# Patient Record
Sex: Male | Born: 1985 | Race: White | Hispanic: No | Marital: Married | Smoking: Current every day smoker
Health system: Southern US, Community
[De-identification: ages and names within clinical notes are randomized; demographics above are authoritative.]

## PROBLEM LIST (undated history)

## (undated) DIAGNOSIS — J189 Pneumonia, unspecified organism: Secondary | ICD-10-CM

## (undated) DIAGNOSIS — J9383 Other pneumothorax: Secondary | ICD-10-CM

## (undated) DIAGNOSIS — F32A Depression, unspecified: Secondary | ICD-10-CM

## (undated) DIAGNOSIS — K219 Gastro-esophageal reflux disease without esophagitis: Secondary | ICD-10-CM

## (undated) DIAGNOSIS — F419 Anxiety disorder, unspecified: Secondary | ICD-10-CM

## (undated) DIAGNOSIS — Z720 Tobacco use: Secondary | ICD-10-CM

## (undated) DIAGNOSIS — G2581 Restless legs syndrome: Secondary | ICD-10-CM

## (undated) HISTORY — PX: OTHER SURGICAL HISTORY: SHX169

## (undated) HISTORY — DX: Other pneumothorax: J93.83

---

## 2014-02-17 ENCOUNTER — Inpatient Hospital Stay (HOSPITAL_COMMUNITY): Payer: Self-pay

## 2014-02-17 ENCOUNTER — Inpatient Hospital Stay (HOSPITAL_COMMUNITY)
Admission: EM | Admit: 2014-02-17 | Discharge: 2014-02-21 | DRG: 201 | Disposition: A | Discharge status: Home / Self Care | Payer: Self-pay | Attending: Thoracic Surgery (Cardiothoracic Vascular Surgery) | Admitting: Thoracic Surgery (Cardiothoracic Vascular Surgery)

## 2014-02-17 ENCOUNTER — Emergency Department (HOSPITAL_COMMUNITY): Payer: Self-pay

## 2014-02-17 ENCOUNTER — Encounter (HOSPITAL_COMMUNITY): Payer: Self-pay | Admitting: Emergency Medicine

## 2014-02-17 DIAGNOSIS — J939 Pneumothorax, unspecified: Secondary | ICD-10-CM

## 2014-02-17 DIAGNOSIS — F17200 Nicotine dependence, unspecified, uncomplicated: Secondary | ICD-10-CM | POA: Diagnosis present

## 2014-02-17 DIAGNOSIS — Y838 Other surgical procedures as the cause of abnormal reaction of the patient, or of later complication, without mention of misadventure at the time of the procedure: Secondary | ICD-10-CM | POA: Diagnosis present

## 2014-02-17 DIAGNOSIS — R079 Chest pain, unspecified: Secondary | ICD-10-CM

## 2014-02-17 DIAGNOSIS — J9383 Other pneumothorax: Secondary | ICD-10-CM

## 2014-02-17 DIAGNOSIS — J930 Spontaneous tension pneumothorax: Principal | ICD-10-CM | POA: Diagnosis present

## 2014-02-17 DIAGNOSIS — J9311 Primary spontaneous pneumothorax: Secondary | ICD-10-CM

## 2014-02-17 DIAGNOSIS — J95812 Postprocedural air leak: Secondary | ICD-10-CM | POA: Diagnosis present

## 2014-02-17 DIAGNOSIS — Z720 Tobacco use: Secondary | ICD-10-CM

## 2014-02-17 HISTORY — DX: Other pneumothorax: J93.83

## 2014-02-17 HISTORY — DX: Tobacco use: Z72.0

## 2014-02-17 LAB — BASIC METABOLIC PANEL
Anion gap: 14 (ref 5–15)
BUN: 12 mg/dL (ref 6–23)
CO2: 24 mEq/L (ref 19–32)
Calcium: 9.5 mg/dL (ref 8.4–10.5)
Chloride: 99 mEq/L (ref 96–112)
Creatinine, Ser: 1.03 mg/dL (ref 0.50–1.35)
GFR calc Af Amer: >90 mL/min (ref >90)
GFR calc non Af Amer: >90 mL/min (ref >90)
Glucose, Bld: 115 mg/dL — ABNORMAL HIGH (ref 70–99)
Potassium: 4.1 mEq/L (ref 3.7–5.3)
Sodium: 137 mEq/L (ref 137–147)

## 2014-02-17 LAB — CBC WITH DIFFERENTIAL/PLATELET
Basophils Absolute: 0.1 10*3/uL (ref 0.0–0.1)
Basophils Relative: 1 % (ref 0–1)
Eosinophils Absolute: 0.1 10*3/uL (ref 0.0–0.7)
Eosinophils Relative: 1 % (ref 0–5)
HCT: 45.8 % (ref 39.0–52.0)
Hemoglobin: 16.3 g/dL (ref 13.0–17.0)
Lymphocytes Relative: 19 % (ref 12–46)
Lymphs Abs: 2.8 10*3/uL (ref 0.7–4.0)
MCH: 30.1 pg (ref 26.0–34.0)
MCHC: 35.6 g/dL (ref 30.0–36.0)
MCV: 84.5 fL (ref 78.0–100.0)
Monocytes Absolute: 1 10*3/uL (ref 0.1–1.0)
Monocytes Relative: 7 % (ref 3–12)
Neutro Abs: 10.8 10*3/uL — ABNORMAL HIGH (ref 1.7–7.7)
Neutrophils Relative %: 72 % (ref 43–77)
Platelets: 236 10*3/uL (ref 150–400)
RBC: 5.42 MIL/uL (ref 4.22–5.81)
RDW: 13.1 % (ref 11.5–15.5)
WBC: 14.8 10*3/uL — ABNORMAL HIGH (ref 4.0–10.5)

## 2014-02-17 LAB — I-STAT TROPONIN, ED: Troponin i, poc: 0.01 ng/mL (ref 0.00–0.08)

## 2014-02-17 MED ORDER — LIDOCAINE-EPINEPHRINE 2 %-1:200000 IJ SOLN
10.0000 mL | Freq: Once | INTRAMUSCULAR | Status: AC
Start: 2014-02-17 — End: 2014-02-17
  Administered 2014-02-17: 10 mL via INTRADERMAL

## 2014-02-17 MED ORDER — OXYCODONE HCL 5 MG PO TABS: 5.0000 mg | ORAL_TABLET | ORAL | Status: DC | PRN

## 2014-02-17 MED ORDER — DOCUSATE SODIUM 100 MG PO CAPS
200.0000 mg | ORAL_CAPSULE | Freq: Every day | ORAL | Status: DC
  Administered 2014-02-17 – 2014-02-21 (×4): 200 mg via ORAL
  Filled 2014-02-17 (×5): qty 2

## 2014-02-17 MED ORDER — MORPHINE SULFATE 2 MG/ML IJ SOLN: 2.0000 mg | INTRAMUSCULAR | Status: DC | PRN

## 2014-02-17 MED ORDER — OXYCODONE HCL 5 MG PO TABS
10.0000 mg | ORAL_TABLET | ORAL | Status: DC | PRN
  Administered 2014-02-17: 10 mg via ORAL
  Filled 2014-02-17: qty 2

## 2014-02-17 MED ORDER — MORPHINE SULFATE 2 MG/ML IJ SOLN
2.0000 mg | INTRAMUSCULAR | Status: DC | PRN
  Administered 2014-02-17 – 2014-02-18 (×4): 2 mg via INTRAVENOUS
  Filled 2014-02-17 (×4): qty 1

## 2014-02-17 MED ORDER — MORPHINE SULFATE 4 MG/ML IJ SOLN
4.0000 mg | Freq: Once | INTRAMUSCULAR | Status: AC
  Administered 2014-02-17: 4 mg via INTRAVENOUS
  Filled 2014-02-17: qty 1

## 2014-02-17 MED ORDER — NICOTINE 21 MG/24HR TD PT24
21.0000 mg | MEDICATED_PATCH | Freq: Every day | TRANSDERMAL | Status: DC
  Administered 2014-02-17 – 2014-02-21 (×6): 21 mg via TRANSDERMAL
  Filled 2014-02-17 (×5): qty 1

## 2014-02-17 MED ORDER — MIDAZOLAM HCL 2 MG/2ML IJ SOLN
INTRAMUSCULAR | Status: AC
  Filled 2014-02-17: qty 2

## 2014-02-17 MED ORDER — MIDAZOLAM HCL 2 MG/2ML IJ SOLN
4.0000 mg | Freq: Once | INTRAMUSCULAR | Status: AC
  Administered 2014-02-17: 2 mg via INTRAVENOUS
  Filled 2014-02-17: qty 4

## 2014-02-17 MED ORDER — ACETAMINOPHEN 325 MG PO TABS: 650.0000 mg | ORAL_TABLET | Freq: Four times a day (QID) | ORAL | Status: DC | PRN

## 2014-02-17 MED ORDER — LIDOCAINE HCL 1 % IJ SOLN
20.0000 mL | Freq: Once | INTRAMUSCULAR | Status: DC
  Filled 2014-02-17: qty 20

## 2014-02-17 NOTE — H&P (Signed)
[address]       [place] [address]             [phone]          CARDIOTHORACIC SURGERY HISTORY AND PHYSICAL EXAM  PCP is No primary care provider on file. Referring Provider is No ref. provider found   Chief Complaint:  Left-sided chest pain and shortness of breath  HPI:  Patient is a 28 year old white male with history of tobacco abuse who developed sudden onset left-sided chest discomfort 3 days ago. The symptoms came on spontaneously without any preceding history of trauma. The patient was seen briefly in the emergency department at [facility] in [place] where an EKG was done. The patient left the emergency department without having a chest x-ray performed. Over the past 72 hours he has developed resting shortness of breath.  He reports persistent left-sided chest discomfort that is made worse by taking a deep breath or cough. He has otherwise been in these usual state of health. There is no recent history of trauma. There is no history of previous spontaneous pneumothorax.  Past Medical History  Diagnosis Date  . Tobacco abuse 02/17/2014    History reviewed. No pertinent past surgical history.  History reviewed. No pertinent family history.  Social History History  Substance Use Topics  . Smoking status: Current Every Day Smoker  . Smokeless tobacco: Not on file  . Alcohol Use: No    Prior to Admission medications   Not on File    No Known Allergies  Review of Systems:  General:  normal appetite, normal energy   Respiratory:  no cough, no wheezing, no hemoptysis, + pain with inspiration or cough, + shortness of breath   Cardiac:  + chest pain or tightness, no exertional SOB, no resting SOB, no PND, no orthopnea, no LE edema, no palpitations, no syncope  GI:   no difficulty swallowing, no hematochezia, no hematemesis, no melena, no constipation, no diarrhea   GU:   no dysuria, no urgency, no frequency   Musculoskeletal: no  arthritis, no arthralgia   Vascular:  no pain suggestive of claudication   Neuro:   no symptoms suggestive of TIA's, no seizures, no headaches, no peripheral neuropathy   Endocrine:  Negative   HEENT:  no loose teeth or painful teeth,  no recent vision changes  Psych:   no anxiety, no depression    Physical Exam:   BP 125/87 mmHg  Pulse 77  Temp(Src) 98 F (36.7 C) (Oral)  Resp 15  Ht 5\' 11"  (1.803 m)  Wt 68.04 kg (150 lb)  BMI 20.93 kg/m2  SpO2 96%  General:  Thin, well-appearing  HEENT:  Unremarkable   Neck:   no JVD, no bruits, no adenopathy, trachea midline  Chest:   Diminished breath sounds on left side, no wheezes, no rhonchi   CV:   RRR, no murmur   Abdomen:  soft, non-tender, no masses   Extremities:  warm, well-perfused, pulses palpable  Rectal/GU  Deferred  Neuro:   Grossly non-focal and symmetrical throughout  Skin:   Clean and dry, no rashes, no breakdown  Diagnostic Tests:  CHEST 2 VIEW  COMPARISON: None.  FINDINGS: There is a large LEFT-sided pneumothorax. Focal opacity at the LEFT lung apex could be atelectasis or pneumonia, mass is felt less likely. Slight tension component LEFT-to-RIGHT with shifting of the mediastinal structures. No effusion. RIGHT lung is hyperinflated but clear. No osseous findings.  IMPRESSION: Large LEFT pneumothorax, early tension component.  Critical Value/emergent results were called by telephone at the time of interpretation on 02/17/2014 at 6:39 pm to Dr. Jola Schmidt, who verbally acknowledged these results.   Electronically Signed  By: Rolla Flatten M.D.  On: 02/17/2014 18:41  Impression:  Left-sided primary spontaneous pneumothorax. The lung is completely collapsed and the patient has resting shortness of breath. There is no history of previous spontaneous pneumothorax and no recent history of trauma.    Plan:  Chest tube placement and subsequent hospital admission. I have reviewed the clinical  picture at length with the patient and his family. Indications, risks, potential benefits of chest tube placement have been explained. Expectations for his hospital course and subsequent recovery have been discussed. Risks of delayed recurrence of been discussed. All their questions been addressed.   I spent in excess of 30 minutes during the conduct of this hospital encounter and >50% of this time involved direct face-to-face encounter with the patient for counseling and/or coordination of their care.   Valentina Gu. Roxy Manns, MD

## 2014-02-17 NOTE — ED Provider Notes (Signed)
[place] of left-sided nonradiating anterior pleuritic chest pain onset 3 days ago pain is constant. No cough. No other associated symptoms. On exam no distress speaks in paragraphs. Lungs diminished breath sounds left side. Chest x-ray reviewed by me.  Orlie Dakin, MD 02/18/14 [phone]

## 2014-02-17 NOTE — Plan of Care (Signed)
Problem: Phase I Progression Outcomes Goal: O2 sats > or equal 90% or at baseline Outcome: Completed/Met Date Met:  02/17/14 Goal: Dyspnea controlled at rest Outcome: Completed/Met Date Met:  02/17/14 Goal: Hemodynamically stable Outcome: Completed/Met Date Met:  02/17/14

## 2014-02-17 NOTE — Op Note (Signed)
CARDIOTHORACIC SURGERY OPERATIVE NOTE  Date of Procedure:  02/17/2014  Preoperative Diagnosis: Left Spontaneous Pneumothorax  Postoperative Diagnosis: Same  Procedure:   Left chest tube placement  Surgeon:   Valentina Gu. Roxy Manns, MD  Anesthesia: 1% lidocaine local with intravenous sedation    DETAILS OF THE OPERATIVE PROCEDURE  Following full informed consent the patient was given midazolam 2 mg intravenously and continuously monitored for rhythm, BP and oxygen saturation. The left chest was prepared and draped in a sterile manner. 1% lidocaine was utilized to anesthetize the skin and subcutaneous tissues. A small incision was made and a 28 French straight chest tube was placed through the incision into the pleural space. The tube was secured to the skin and connected to a closed suction collection device. The patient tolerated the procedure well. A portable CXR was ordered. There were no complications.    Valentina Gu. Roxy Manns, MD 02/17/2014 8:39 PM

## 2014-02-17 NOTE — ED Notes (Signed)
Pt presents with chest pain and shortness of breath since Thursday.  Pt describes pain as pressure, admits to a dry cough for the same period of time.  Denies fever or chills.  Admits that shortness of breath is worse with movement.  Pt speaking in full sentences at present, respirations e/u.  No acute distress noted at this time.

## 2014-02-17 NOTE — ED Provider Notes (Signed)
CSN: [id number]     Arrival date & time 02/17/14  1736 History   First MD Initiated Contact with Patient 02/17/14 1842     Chief Complaint  Patient presents with  . Chest Pain    Patient is a 28 y.o. male presenting with shortness of breath. The history is provided by the patient.  Shortness of Breath Severity:  Severe Onset quality:  Sudden Duration:  3 days Timing:  Constant Progression:  Worsening Chronicity:  New Context: not URI   Relieved by:  None tried Worsened by:  Nothing tried Ineffective treatments:  None tried Associated symptoms: chest pain   Associated symptoms: no abdominal pain   Chest pain:    Quality:  Sharp and stabbing   Severity:  Severe   Onset quality:  Sudden   Duration:  3 days   Timing:  Constant   Progression:  Improving   Chronicity:  New Risk factors: tobacco use     History reviewed. No pertinent past medical history.   History reviewed. No pertinent past surgical history.   No family history on file.   History  Substance Use Topics  . Smoking status: Current Every Day Smoker  . Smokeless tobacco: Not on file  . Alcohol Use: No    Review of Systems  Respiratory: Positive for shortness of breath.   Cardiovascular: Positive for chest pain.  Gastrointestinal: Negative for abdominal pain.  Neurological: Negative for syncope.  All other systems reviewed and are negative.   Allergies  Review of patient's allergies indicates no known allergies.  Home Medications   Prior to Admission medications   Not on File   BP 140/83 mmHg  Pulse 93  Temp(Src) 98 F (36.7 C) (Oral)  Resp 18  Ht 5\' 11"  (1.803 m)  Wt 150 lb (68.04 kg)  BMI 20.93 kg/m2  SpO2 99%   Physical Exam  Constitutional: He is oriented to person, place, and time. He appears well-developed and well-nourished. No distress.  HENT:  Head: Normocephalic and atraumatic.  Right Ear: External ear normal.  Left Ear: External ear normal.  Mouth/Throat: Oropharynx is  clear and moist.  Neck: Normal range of motion.  Cardiovascular: Normal rate and regular rhythm.   Pulmonary/Chest: Effort normal. No respiratory distress. He has decreased breath sounds in the left upper field, the left middle field and the left lower field. He has no wheezes. He has no rales.  Abdominal: Soft. He exhibits no distension. There is no tenderness. There is no rebound and no guarding.  Neurological: He is alert and oriented to person, place, and time.  Skin: Skin is warm and dry. No rash noted. He is not diaphoretic.  Psychiatric: He has a normal mood and affect.  Vitals reviewed.   ED Course  Procedures  Labs Review Labs Reviewed  CBC WITH DIFFERENTIAL - Abnormal; Notable for the following:    WBC 14.8 (*)    Neutro Abs 10.8 (*)    All other components within normal limits  BASIC METABOLIC PANEL - Abnormal; Notable for the following:    Glucose, Bld 115 (*)    All other components within normal limits  I-STAT TROPOININ, ED    Imaging Review Dg Chest 2 View  02/17/2014   CLINICAL DATA:  LEFT-sided chest pain. This reportedly started 02/14/2014. Shortness of breath. Patient is a smoker. Initial encounter.  EXAM: CHEST  2 VIEW  COMPARISON:  None.  FINDINGS: There is a large LEFT-sided pneumothorax. Focal opacity at the LEFT lung apex  could be atelectasis or pneumonia, mass is felt less likely. Slight tension component LEFT-to-RIGHT with shifting of the mediastinal structures. No effusion. RIGHT lung is hyperinflated but clear. No osseous findings.  IMPRESSION: Large LEFT pneumothorax, early tension component.  Critical Value/emergent results were called by telephone at the time of interpretation on 02/17/2014 at 6:39 pm to Dr. Jola Schmidt, who verbally acknowledged these results.   Electronically Signed   By: Rolla Flatten M.D.   On: 02/17/2014 18:41   Dg Chest Port 1 View  02/17/2014   CLINICAL DATA:  Pneumothorax. Chest pain on the left. Cough. Smoker.  EXAM: PORTABLE  CHEST - 1 VIEW  COMPARISON:  02/17/2014  FINDINGS: Interval placement of a left chest tube with re-expansion of the left lung. There is a minimal residual left apical pneumothorax. Right lung remains clear and expanded. Heart size and pulmonary vascularity are normal.  IMPRESSION: Interval placement of left chest tube with re-expansion of left lung. Minimal residual left apical pneumothorax.   Electronically Signed   By: Lucienne Capers M.D.   On: 02/17/2014 21:19     EKG Interpretation   Date/Time:  Sunday February 17 2014 17:49:34 EST Ventricular Rate:  93 PR Interval:  156 QRS Duration: 82 QT Interval:  328 QTC Calculation: 407 R Axis:   94 Text Interpretation:  Normal sinus rhythm No old tracing to compare  Confirmed by Winfred Leeds  MD, SAM [phone]) on 02/17/2014 7:09:46 PM      MDM   Final diagnoses:  Chest pain  Pneumothorax    28 y.o. otherwise healthy male presents to the ED due to severe left-sided chest pain and shortness of breath that began 3 days ago. Began suddenly while walking from the shower. 3 days ago he went to an outside facility where he had an EKG that was reportedly unremarkable. He left prior to having a chest x-ray.   He presents today due to worsening shortness of breath. Chest x-ray was obtained which showed a large left pneumothorax. CBC notable for mild leukocytosis or anemia. BMP unremarkable.  Cardiothoracic surgery consult. They saw and evaluated the patient and placed a chest tube in the ED with no complications. He was admitted to the cardiothoracic surgery service.  This case was managed in conjunction with my attending, Dr. Winfred Leeds.     Berenice Primas, MD 02/18/14 [place], MD 02/18/14 [phone]

## 2014-02-18 ENCOUNTER — Inpatient Hospital Stay (HOSPITAL_COMMUNITY): Payer: Self-pay

## 2014-02-18 LAB — CREATININE, SERUM
Creatinine, Ser: 0.98 mg/dL (ref 0.50–1.35)
GFR calc Af Amer: >90 mL/min (ref >90)
GFR calc non Af Amer: >90 mL/min (ref >90)

## 2014-02-18 LAB — CBC
HCT: 48.2 % (ref 39.0–52.0)
Hemoglobin: 17 g/dL (ref 13.0–17.0)
MCH: 30 pg (ref 26.0–34.0)
MCHC: 35.3 g/dL (ref 30.0–36.0)
MCV: 85.2 fL (ref 78.0–100.0)
Platelets: 230 10*3/uL (ref 150–400)
RBC: 5.66 MIL/uL (ref 4.22–5.81)
RDW: 13.1 % (ref 11.5–15.5)
WBC: 16.1 10*3/uL — ABNORMAL HIGH (ref 4.0–10.5)

## 2014-02-18 MED ORDER — FENTANYL CITRATE 0.05 MG/ML IJ SOLN: 50.0000 ug | INTRAMUSCULAR | Status: DC | PRN

## 2014-02-18 MED ORDER — ONDANSETRON HCL 4 MG/2ML IJ SOLN
4.0000 mg | Freq: Four times a day (QID) | INTRAMUSCULAR | Status: DC | PRN
  Administered 2014-02-18: 4 mg via INTRAVENOUS
  Filled 2014-02-18: qty 2

## 2014-02-18 MED ORDER — ENOXAPARIN SODIUM 40 MG/0.4ML ~~LOC~~ SOLN
40.0000 mg | SUBCUTANEOUS | Status: DC
  Administered 2014-02-18: 40 mg via SUBCUTANEOUS
  Filled 2014-02-18 (×3): qty 0.4

## 2014-02-18 MED ORDER — KETOROLAC TROMETHAMINE 15 MG/ML IJ SOLN: 15.0000 mg | Freq: Four times a day (QID) | INTRAMUSCULAR | Status: AC | PRN

## 2014-02-18 NOTE — Progress Notes (Signed)
No n/v since 9am, pain stable at 2/10. Patient tolerated eating sandwich for lunch. CT to sxn, per MD can be taken off for patient to ambulate. Patient ambulated unit x 1, tolerated well. CT output 20cc for shift. Will continue to monitor.

## 2014-02-18 NOTE — Progress Notes (Signed)
Reviewed Student nurse assessment and agree with documentation. Darel Hong M

## 2014-02-18 NOTE — Progress Notes (Signed)
Utilization Review Completed.Donne Anon T11/16/2015

## 2014-02-18 NOTE — Plan of Care (Signed)
Problem: Consults Goal: Respiratory Problems Patient Education See Patient Education Module for education specifics.  Outcome: Progressing  Problem: Phase I Progression Outcomes Goal: Pain controlled Outcome: Progressing  Problem: Phase II Progression Outcomes Goal: O2 sats > equal to 90% on RA or at baseline Outcome: Completed/Met Date Met:  02/18/14

## 2014-02-18 NOTE — Progress Notes (Signed)
      [address]       [place] [address]             [phone]      CARDIOTHORACIC SURGERY PROGRESS NOTE   Subjective: Some nausea earlier associated with morphine administration.  Feels better now.  Mild soreness from tube.  Objective: Vital signs in last 24 hours: Temp:  [98 F (36.7 C)-100.2 F (37.9 C)] 98.2 F (36.8 C) (11/16 0520) Pulse Rate:  [56-102] 56 (11/16 0520) Cardiac Rhythm:  [-] Normal sinus rhythm;Sinus bradycardia (11/16 0813) Resp:  [11-24] 18 (11/16 0520) BP: (111-154)/(74-98) 154/96 mmHg (11/16 0520) SpO2:  [95 %-100 %] 98 % (11/16 0520) Weight:  [63 kg (138 lb 14.2 oz)-68.04 kg (150 lb)] 63 kg (138 lb 14.2 oz) (11/15 2300)  Physical Exam:  Rhythm:   sinus  Breath sounds: clear  Heart sounds:  RRR  Incisions:  n/a  Abdomen:  soft  Extremities:  warm  Chest tube(s):  No air leak   Intake/Output from previous day: 11/15 0701 - 11/16 0700 In: -  Out: 475 [Urine:475] Intake/Output this shift:    Lab Results:  Recent Labs  02/17/14 1754 02/18/14 0701  WBC 14.8* 16.1*  HGB 16.3 17.0  HCT 45.8 48.2  PLT 236 230   BMET:  Recent Labs  02/17/14 1754 02/18/14 0701  NA 137  --   K 4.1  --   CL 99  --   CO2 24  --   GLUCOSE 115*  --   BUN 12  --   CREATININE 1.03 0.98  CALCIUM 9.5  --     CBG (last 3)  No results for input(s): GLUCAP in the last 72 hours.  CXR:  PORTABLE CHEST - 1 VIEW  COMPARISON: 02/17/2014.  FINDINGS: Trachea is midline. Heart size normal. Small left apical pneumothorax persists, with left chest tube in place, projecting over the medial aspect of the upper left hemi thorax. Lungs are clear. No pleural fluid.  IMPRESSION: Small left apical pneumothorax, stable or minimally increased, with left chest tube in place.   Electronically Signed  By: Lorin Picket M.D.  On: 02/18/2014 08:13  Assessment/Plan:  Complete reexpansion of left lung following chest tube.  No air leak  noted at present.  Keep tube on suction today.   I spent in excess of 15 minutes during the conduct of this hospital encounter and >50% of this time involved direct face-to-face encounter with the patient for counseling and/or coordination of their care.   OWEN,CLARENCE H 02/18/2014 11:35 AM

## 2014-02-19 ENCOUNTER — Inpatient Hospital Stay (HOSPITAL_COMMUNITY): Payer: Self-pay

## 2014-02-19 NOTE — Plan of Care (Signed)
Problem: Phase II Progression Outcomes Goal: Tolerating diet Outcome: Completed/Met Date Met:  02/19/14

## 2014-02-19 NOTE — Progress Notes (Addendum)
       [address]       [place] [address]             [phone]               Subjective: Comfortable, not requiring any pain meds.  Breathing stable.   Objective: Vital signs in last 24 hours: Patient Vitals for the past 24 hrs:  BP Temp Temp src Pulse Resp SpO2  02/19/14 0750 118/80 mmHg 98.3 F (36.8 C) Oral 62 18 97 %  02/19/14 0346 124/79 mmHg 98.6 F (37 C) Oral (!) 51 18 99 %  02/18/14 2008 (!) 145/84 mmHg 98.4 F (36.9 C) Oral 61 18 98 %  02/18/14 1354 130/85 mmHg 97.9 F (36.6 C) Oral 65 20 99 %   Current Weight  02/17/14 138 lb 14.2 oz (63 kg)     Intake/Output from previous day: 11/16 0701 - 11/17 0700 In: 240 [P.O.:240] Out: 290 [Chest Tube:290]    PHYSICAL EXAM:  Heart: RRR Lungs: Clear Wound: Chest tube site dressed and dry Chest tube: No definite air leak but has some tidaling with cough    Lab Results: CBC: Recent Labs  02/17/14 1754 02/18/14 0701  WBC 14.8* 16.1*  HGB 16.3 17.0  HCT 45.8 48.2  PLT 236 230   BMET:  Recent Labs  02/17/14 1754 02/18/14 0701  NA 137  --   K 4.1  --   CL 99  --   CO2 24  --   GLUCOSE 115*  --   BUN 12  --   CREATININE 1.03 0.98  CALCIUM 9.5  --     PT/INR: No results for input(s): LABPROT, INR in the last 72 hours.    Assessment/Plan: S/P  L CT for spontaneous ptx- stable. CXR not done yet this am. Will follow up CXR.  Hopefully can place CT to water seal- no definite leak, just some tidaling with cough.    LOS: 2 days    COLLINS,GINA H 02/19/2014  ADDENDUM:   EXAM: PORTABLE CHEST - 1 VIEW  COMPARISON: 02/18/2014  FINDINGS: Small left apical pneumothorax, estimated at 10-15%, is unchanged from the previous day's study allowing for differences in patient positioning. Left chest tube is stable.  Mild stable scarring at the right apex. Lungs otherwise clear. No pleural effusion.  Heart, mediastinum and hila are unremarkable.  IMPRESSION: 1. No  change from the previous day's study. 2. Small persistent left pneumothorax. Stable position of the left chest tube.     I have seen and examined the patient and agree with the assessment and plan as outlined.  Chest tube to water seal   OWEN,CLARENCE H 02/19/2014 10:59 AM

## 2014-02-20 ENCOUNTER — Inpatient Hospital Stay (HOSPITAL_COMMUNITY): Payer: Self-pay

## 2014-02-20 MED ORDER — ENOXAPARIN SODIUM 30 MG/0.3ML ~~LOC~~ SOLN
30.0000 mg | SUBCUTANEOUS | Status: DC
  Filled 2014-02-20 (×2): qty 0.3

## 2014-02-20 NOTE — Progress Notes (Signed)
      [address]       [place] [address]             [phone]      CARDIOTHORACIC SURGERY PROGRESS NOTE  Subjective: Feels well  Objective: Vital signs in last 24 hours: Temp:  [98 F (36.7 C)-98.6 F (37 C)] 98 F (36.7 C) (11/18 0852) Pulse Rate:  [59-71] 61 (11/18 0852) Cardiac Rhythm:  [-] Normal sinus rhythm (11/18 0800) Resp:  [18] 18 (11/18 0447) BP: (124-146)/(82-87) 124/82 mmHg (11/18 0852) SpO2:  [97 %-100 %] 100 % (11/18 0923)  Physical Exam:  Rhythm:   sinus  Breath sounds: clear  Heart sounds:  RRR  Incisions:  n/a  Abdomen:  soft  Extremities:  warm  Chest tube(s):  + air leak but only w/ cough   Intake/Output from previous day: 11/17 0701 - 11/18 0700 In: 1080 [P.O.:1080] Out: -  Intake/Output this shift:    Lab Results:  Recent Labs  02/17/14 1754 02/18/14 0701  WBC 14.8* 16.1*  HGB 16.3 17.0  HCT 45.8 48.2  PLT 236 230   BMET:  Recent Labs  02/17/14 1754 02/18/14 0701  NA 137  --   K 4.1  --   CL 99  --   CO2 24  --   GLUCOSE 115*  --   BUN 12  --   CREATININE 1.03 0.98  CALCIUM 9.5  --     CBG (last 3)  No results for input(s): GLUCAP in the last 72 hours.  CXR:  PORTABLE CHEST - 1 VIEW  COMPARISON: 02/19/2014 and earlier.  FINDINGS: Portable AP semi upright view at 0525 hrs. Stable left chest tube. Size of the residual pneumothorax stable to slightly improved. Pleural edge still visible in apex and along the upper 2/3 of the lateral chest. Normal cardiac size and mediastinal contours. Visualized tracheal air column is within normal limits. No focal pulmonary opacity.  IMPRESSION: Stable left chest tube with stable to slightly improved left pneumothorax volume.   Electronically Signed  By: Lars Pinks M.D.  On: 02/20/2014 06:42  Assessment/Plan:  Persistent small air leak Will leave tube on water seal Chest CT to r/o blebs to guide intervention if leak persists  I spent in  excess of 15 minutes during the conduct of this hospital encounter and >50% of this time involved direct face-to-face encounter with the patient for counseling and/or coordination of their care.   Marchel Foote H 02/20/2014 9:08 AM

## 2014-02-20 NOTE — Plan of Care (Signed)
Problem: Phase II Progression Outcomes Goal: Dyspnea controlled w/progressive activity Outcome: Completed/Met Date Met:  02/20/14

## 2014-02-20 NOTE — Plan of Care (Signed)
Problem: Phase II Progression Outcomes Goal: Pain controlled on oral analgesia Outcome: Completed/Met Date Met:  02/20/14     

## 2014-02-21 ENCOUNTER — Inpatient Hospital Stay (HOSPITAL_COMMUNITY): Payer: MEDICAID

## 2014-02-21 DIAGNOSIS — J9311 Primary spontaneous pneumothorax: Secondary | ICD-10-CM

## 2014-02-21 MED ORDER — NICOTINE 21 MG/24HR TD PT24: 21.0000 mg | MEDICATED_PATCH | Freq: Every day | TRANSDERMAL | Status: DC

## 2014-02-21 MED ORDER — OXYCODONE HCL 5 MG PO TABS: 5.0000 mg | ORAL_TABLET | Freq: Four times a day (QID) | ORAL | Status: DC | PRN

## 2014-02-21 NOTE — Progress Notes (Signed)
Assessment unchanged. Discussed D/C instructions with pt including f/u appointments and new medications. Verbalized understanding. RX given to pt. IV and tele removed. Pt left with belongings accompanied by RN.

## 2014-02-21 NOTE — Discharge Instructions (Signed)
Chest Tube A chest tube is a small, flexible drainage tube that is put into the chest. The tube drains fluid, blood, or extra air that has built up between the lungs and the inside of the chest wall (pleural space). Fluid or air can build up in this area for various reasons. When this occurs, it can prevent the lung from expanding completely and cause breathing problems. This can be dangerous. The chest tube allows the lung to re-expand. The procedure to put in the chest tube involves inserting the tube through the skin between the ribs and into the pleural space. LET [facility] CARE PROVIDER KNOW ABOUT:   Any allergies you have.  All medicines you are taking, including vitamins, herbs, eye drops, creams, and over-the-counter medicines.  Previous problems you or members of your family have had with the use of anesthetics.  Any blood disorders you have.  Previous surgeries you have had.  Medical conditions you have. RISKS AND COMPLICATIONS Generally, this is a safe procedure. However, as with any procedure, problems can occur. Possible problems include:  Bleeding.  Injury to the lung.  Infection.  Chest tube failing to work properly, usually due to leaking of air around the tube, or tube positioning in a place where all of the fluid or air cannot be drained.  Problems related to the use of anesthetics or pain medicines. BEFORE THE PROCEDURE  Ask your health care provider if there are any special preparatory instructions such as not eating before the procedure. Follow these instructions exactly. PROCEDURE  The area where the chest tube will be inserted is numbed with a medicine (local anesthetic). You may be given medicine for pain and medicine to help you relax (sedative). An incision is made between the ribs, and a small opening is made through the inner lining of the chest wall. The chest tube is inserted through this opening and into the pleural space. The other end of the chest  tube may be connected to a plastic container that collects the fluid drained from the pleural space and has sterile water to make a one-way seal, or "water seal," that prevents air from going back into the pleural space. Suction is sometimes attached to the system for drainage. A stitch (suture) or tape is used to keep the tube in place.  AFTER THE PROCEDURE  A chest X-ray will be done to check the position of the chest tube. You will be monitored for breathing difficulties, air leaks in the chest tube, and the need for additional oxygen. You will be encouraged to breathe deeply. You may be given antibiotic medicine to prevent or treat infection. The chest tube will stay in place until all the extra air or fluid has drained from the chest. You will likely need to stay in the hospital until the chest tube is removed. In rare cases, you may go home with the chest tube in place. Document Released: 06/30/2006 Document Revised: 03/27/2013 Document Reviewed: 09/27/2012 [facility] Patient Information 2015 [place], [place]. This information is not intended to replace advice given to you by your health care provider. Make sure you discuss any questions you have with your health care provider. Pneumothorax A pneumothorax, commonly called a collapsed lung, is a condition in which air leaks from a lung and builds up in the space between the lung and the chest wall (pleural space). The air in a pneumothorax is trapped outside the lung and takes up space, preventing the lung from fully expanding. This is a  condition that usually occurs suddenly. The buildup of air may be small or large. A small pneumothorax may go away on its own. When a pneumothorax is larger, it will often require medical treatment and hospitalization.  CAUSES  A pneumothorax can sometimes happen quickly with no apparent cause. People with underlying lung problems, particularly COPD or emphysema, are at higher risk of pneumothorax. However, pneumothorax  can happen quickly even in people with no prior known lung problems. Trauma, surgery, medical procedures, or injury to the chest wall can also cause a pneumothorax. SIGNS AND SYMPTOMS  Sometimes a pneumothorax will have no symptoms. When symptoms are present, they can include:  Chest pain.  Shortness of breath.  Increased rate of breathing.  Bluish color to your lips or skin (cyanosis). DIAGNOSIS  Pneumothorax is usually diagnosed by a chest X-ray or chest CT scan. Your health care provider will also take a medical history and perform a physical exam to determine why you may have a pneumothorax. TREATMENT  A small pneumothorax may go away on its own without treatment. Extra oxygen can sometimes help a small pneumothorax go away more quickly. For a larger pneumothorax or a pneumothorax that is causing symptoms, a procedure is usually needed to drain the air.In some cases, the health care provider may drain the air using a needle. In other cases, a chest tube may be inserted into the pleural space. A chest tube is a small tube placed between the ribs and into the pleural space. This removes the extra air and allows the lung to expand back to its normal size. A large pneumothorax will usually require a hospital stay. If there is ongoing air leakage into the pleural space, then the chest tube may need to remain in place for several days until the air leak has healed. In some cases, surgery may be needed.  HOME CARE INSTRUCTIONS   Only take over-the-counter or prescription medicines as directed by your health care provider.  If a cough or pain makes it difficult for you to sleep at night, try sleeping in a semi-upright position in a recliner or by using 2 or 3 pillows.  Rest and limit activity as directed by your health care provider.  If you had a chest tube and it was removed, ask your health care provider when it is okay to remove the dressing. Until your health care provider says you can  remove the dressing, do not allow it to get wet.  Do not smoke. Smoking is a risk factor for pneumothorax.  Do not fly in an airplane or scuba dive until your health care provider says it is okay.  Follow up with your health care provider as directed. SEEK IMMEDIATE MEDICAL CARE IF:   You have increasing chest pain or shortness of breath.  You have a cough that is not controlled with suppressants.  You begin coughing up blood.  You have pain that is getting worse or is not controlled with medicines.  You cough up thick, discolored mucus (sputum) that is yellow to green in color.  You have redness, increasing pain, or discharge at the site where a chest tube had been in place (if your pneumothorax was treated with a chest tube).  The site where your chest tube was located opens up.  You feel air coming out of the site where the chest tube was placed.  You have a fever or persistent symptoms for more than 2-3 days.  You have a fever and your symptoms  suddenly get worse. MAKE SURE YOU:   Understand these instructions.  Will watch your condition.  Will get help right away if you are not doing well or get worse. Document Released: 03/22/2005 Document Revised: 01/10/2013 Document Reviewed: 10/19/2012 [facility] Patient Information 2015 [place], [place]. This information is not intended to replace advice given to you by your health care provider. Make sure you discuss any questions you have with your health care provider.

## 2014-02-21 NOTE — Discharge Summary (Signed)
[address]       [place] [address]             [phone]              Discharge Summary  Name: Brendan Mathis DOB: Mar 21, 1986 28 y.o. MRN: [id number]   Admission Date: 02/17/2014 Discharge Date: 02/21/2014    Admitting Diagnosis: Principal Problem:   Spontaneous pneumothorax Active Problems:   Tobacco abuse    Discharge Diagnosis:  Principal Problem:   Spontaneous pneumothorax Active Problems:   Tobacco abuse    Procedures: LEFT CHEST TUBE PLACEMENT - 02/17/2014   HPI:  The patient is a 28 y.o. male with history of tobacco abuse who developed sudden onset left-sided chest discomfort 3 days ago. The symptoms came on spontaneously without any preceding history of trauma. The patient was seen briefly in the emergency department at [facility] in [place] where an EKG was done. The patient left the emergency department without having a chest x-ray performed. Over the past 72 hours, he has developed resting shortness of breath. He reports persistent left-sided chest discomfort that is made worse by taking a deep breath or cough. He has otherwise been in these usual state of health. There is no recent history of trauma. There is no history of previous spontaneous pneumothorax. He presented to the ER at [facility] on the date of admission and a chest x-ray was performed which revealed a large left pneumothorax with almost complete collapse of the lung.  Thoracic surgery was consulted and Dr. Roxy Manns saw the patient. He placed a left sided chest tube in the ER and the patient was admitted for chest tube management.   Hospital Course:  The patient was admitted to [facility] on 02/17/2014. The chest tube was placed to suction with good re-expansion of the lung.  The tube was slowly weaned to water seal. Since that time, there has been a persistent air leak from the chest tube with a small left apical pneumothorax on chest x-ray.  A CT of the chest  was performed on 02/20/2014, but this did not show any bullous disease on the left.  The patient otherwise remained stable and the chest tube was switched to a Mini Express on 02/21/2014.  The patient was discharged home later that day with follow up to be scheduled in the office for delayed chest tube removal.    Recent vital signs:  Filed Vitals:   02/21/14 0900  BP: 124/89  Pulse: 64  Temp: 98.2 F (36.8 C)  Resp: 18    Recent laboratory studies:  CBC:No results for input(s): WBC, HGB, HCT, PLT in the last 72 hours. BMET: No results for input(s): NA, K, CL, CO2, GLUCOSE, BUN, CREATININE, CALCIUM in the last 72 hours.  PT/INR: No results for input(s): LABPROT, INR in the last 72 hours.   Discharge Medications:     Medication List    TAKE these medications        nicotine 21 mg/24hr patch  Commonly known as:  NICODERM CQ - dosed in mg/24 hours  Place 1 patch (21 mg total) onto the skin daily.     oxyCODONE 5 MG immediate release tablet  Commonly known as:  Oxy IR/ROXICODONE  Take 1-2 tablets (5-10 mg total) by mouth every 6 (six) hours as needed for moderate pain.         Discharge Instructions:  The patient is to refrain from driving, heavy lifting or strenuous  activity.  May clean incisions with soap and water.  May resume regular diet.   Follow Up:        Follow-up Information    Follow up with Rexene Alberts, MD.   Specialty:  Cardiothoracic Surgery   Why:  Office will contact you his appointment to see Dr. Roxy Manns next week with a chest x-ray. Please obtain a chest x-ray [facility] imaging 1 hour prior to your appointment with Dr. Roxy Manns. [place] [facility] is located in the same office complex.   Contact information:   [address] [place] [address] [phone]       Follow up with [place].   Why:  arranged HH-RN for Chest tube management   Contact information:   [place]  [address] [phone]        Michale Weikel H 02/21/2014, 2:05 PM

## 2014-02-21 NOTE — Progress Notes (Addendum)
[address]       [place] [address]             [phone]               Subjective: No complaints.   Objective: Vital signs in last 24 hours: Patient Vitals for the past 24 hrs:  BP Temp Temp src Pulse Resp SpO2  02/21/14 0359 (!) 137/96 mmHg 97.6 F (36.4 C) Oral (!) 55 16 95 %  02/20/14 2149 137/82 mmHg 98.5 F (36.9 C) Oral 61 18 99 %  02/20/14 1411 (!) 139/93 mmHg 98.4 F (36.9 C) Oral 62 20 100 %  02/20/14 0852 124/82 mmHg 98 F (36.7 C) Oral 61 - 100 %   Current Weight  02/17/14 138 lb 14.2 oz (63 kg)     Intake/Output from previous day: 11/18 0701 - 11/19 0700 In: 960 [P.O.:960] Out: -     PHYSICAL EXAM:  Heart: RRR Lungs: Clear Chest tube: + air leak with cough which improves with additional coughing    Lab Results: CBC:No results for input(s): WBC, HGB, HCT, PLT in the last 72 hours. BMET: No results for input(s): NA, K, CL, CO2, GLUCOSE, BUN, CREATININE, CALCIUM in the last 72 hours.  PT/INR: No results for input(s): LABPROT, INR in the last 72 hours.   CT Chest: FINDINGS: Sagittal images of the spine are unremarkable. Sagittal view of the sternum is unremarkable.  A left chest tube in place is noted. Subcutaneous emphysema is noted left axilla and left anterior chest wall under pectoral  No acute fractures are noted. There is no mediastinal at the adenoma or adenopathy. Images of the thoracic inlet are unremarkable. Central airways are patent. Heart size within normal limits. No pericardial effusion. No hilar adenopathy. No axillary adenopathy. No pleural effusion.  There is a small left apical pneumothorax. Tiny pneumothorax is noted in left lower lobe anteriorly. There is a small left basilar supra diaphragmatic pneumothorax. Best seen in axial image 66.  No centrilobular emphysema is noted. Tiny paraseptal emphysematous bullae noted in right apex. No lung contusion or segmental infiltrate. No  pulmonary edema. The visualized unenhanced upper abdomen shows bilateral adrenal small linear calcifications. No nephrolithiasis.  IMPRESSION: 1. There is a small left apical and left basilar anterior/lateral pneumothorax. Left chest tube in place. 2. No centrilobular emphysematous bullae. Tiny paraseptal bullae are noted in right apex. 3. No mediastinal hematoma or adenopathy. Subcutaneous emphysema is noted in left axilla and left anterior chest wall. 4. No infiltrate or pulmonary edema.  CXR: FINDINGS: PA and lateral views. Stable antral lateral approach left chest tube. Smaller left side pneumothorax compared to 0525 hr yesterday. Pleural edge now only visible in the left apex. Mildly increased subcutaneous gas at the left axilla.  Normal mediastinal contours, no mediastinal shift. Visualized tracheal air column is within normal limits. Stable right lung, but apical scarring is more apparent on these films. No osseous abnormality identified.  IMPRESSION: 1. Stable left chest tube. Smaller left pneumothorax compared to 0525 hr yesterday. 2. No new cardiopulmonary abnormality.    Assessment/Plan: S/P L CT for spontaneous ptx-  CT shows a rush of air with cough, but no significant leak with ensuing cough.  Will continue CT to water seal for now.  Possibly would benefit from switch to Mini Express.    LOS: 4 days    COLLINS,GINA H 02/21/2014  I have seen and examined the patient and agree with  the assessment and plan as outlined.  Options discussed with patient including continued inpatient observation vs surgery vs discharge home with Mini-Express for delayed chest tube removal.  Advantages and disadvantages of each approach discussed.  He wants to go home with Mini-Express.  Will plan to see him in the office w/ CXR on Monday 11/30  I spent in excess of 15 minutes during the conduct of this hospital encounter and >50% of this time involved direct face-to-face encounter  with the patient for counseling and/or coordination of their care.   OWEN,CLARENCE H 02/21/2014 9:11 AM

## 2014-02-21 NOTE — Progress Notes (Signed)
CT changed to mini express. Connections tightened and secured. Will continue to monitor pt closely.

## 2014-02-21 NOTE — Care Management Note (Signed)
    Page 1 of 1   02/21/2014     12:34:09 PM CARE MANAGEMENT NOTE 02/21/2014  Patient:  Brendan Mathis, Brendan Mathis   Account Number:  [id number]  Date Initiated:  02/21/2014  Documentation initiated by:  Marvetta Gibbons  Subjective/Objective Assessment:   Pt admitted with spont. pntx     Action/Plan:   PTA pt lived at home - plan to return home   Anticipated DC Date:  02/21/2014   Anticipated DC Plan:  [place]  CM consult      [place]   Choice offered to / List presented to:  NA        HH arranged  HH-1 RN      [place].   Status of service:  Completed, signed off Medicare Important Message given?  NO (If response is "NO", the following Medicare IM given date fields will be blank) Date Medicare IM given:   Medicare IM given by:   Date Additional Medicare IM given:   Additional Medicare IM given by:    Discharge Disposition:  [place]  Per UR Regulation:  Reviewed for med. necessity/level of care/duration of stay  If discussed at [place] of Stay Meetings, dates discussed:    Comments:  02/21/14- 1200- Marvetta Gibbons RN, BSN [phone] Pt for d/c home today with Chest Tube to mini express- will need HH-RN- spoke with pt- pt agreeable to [facility]- Referral has been called to Amy with [facility] for HH-RN to assist with CT management- (pt is self pay).

## 2014-02-26 ENCOUNTER — Other Ambulatory Visit: Payer: Self-pay | Admitting: Thoracic Surgery (Cardiothoracic Vascular Surgery)

## 2014-02-26 DIAGNOSIS — J9383 Other pneumothorax: Secondary | ICD-10-CM

## 2014-03-04 ENCOUNTER — Ambulatory Visit
Admission: RE | Admit: 2014-03-04 | Discharge: 2014-03-04 | Disposition: A | Discharge status: Home / Self Care | Payer: No Typology Code available for payment source | Source: Ambulatory Visit | Attending: Thoracic Surgery (Cardiothoracic Vascular Surgery) | Admitting: Thoracic Surgery (Cardiothoracic Vascular Surgery)

## 2014-03-04 ENCOUNTER — Ambulatory Visit (INDEPENDENT_AMBULATORY_CARE_PROVIDER_SITE_OTHER): Payer: No Typology Code available for payment source | Admitting: Thoracic Surgery (Cardiothoracic Vascular Surgery)

## 2014-03-04 ENCOUNTER — Encounter: Payer: Self-pay | Admitting: Thoracic Surgery (Cardiothoracic Vascular Surgery)

## 2014-03-04 ENCOUNTER — Other Ambulatory Visit: Payer: Self-pay | Admitting: *Deleted

## 2014-03-04 VITALS — BP 115/73 | HR 66 | Resp 20 | Ht 71.0 in | Wt 150.0 lb

## 2014-03-04 DIAGNOSIS — Z789 Other specified health status: Secondary | ICD-10-CM

## 2014-03-04 DIAGNOSIS — J9383 Other pneumothorax: Secondary | ICD-10-CM

## 2014-03-04 DIAGNOSIS — Z9689 Presence of other specified functional implants: Secondary | ICD-10-CM

## 2014-03-04 MED ORDER — OXYCODONE HCL 5 MG PO TABS: 5.0000 mg | ORAL_TABLET | Freq: Four times a day (QID) | ORAL | Status: DC | PRN

## 2014-03-04 NOTE — Patient Instructions (Signed)
Leave bandage in place x 48 hours then you may remove it and shower  No lifting or strenuous activity x 7 days

## 2014-03-04 NOTE — Progress Notes (Addendum)
      [address]       [place] [address]             [phone]     CARDIOTHORACIC SURGERY OFFICE NOTE  Referring Provider is No ref. provider found PCP is No primary care provider on file.   HPI:  Patient returns to the office today for follow-up of left spontaneous pneumothorax. He originally presented 02/17/2014 with nearly complete collapse of the left lung. He was treated with chest tube placement with uncomplicated reexpansion of the left lung. However, the patient had a small intermittent persistent air leak for several days. After discussing options the patient elected to go home with a chest tube in place. He returns to the office today for follow-up. Other than pain at the site of the chest tube he reports no problems or complaints.   Current Outpatient Prescriptions  Medication Sig Dispense Refill  . nicotine (NICODERM CQ - DOSED IN MG/24 HOURS) 21 mg/24hr patch Place 1 patch (21 mg total) onto the skin daily. 28 patch 0  . oxyCODONE (OXY IR/ROXICODONE) 5 MG immediate release tablet Take 1 tablet (5 mg total) by mouth every 6 (six) hours as needed for moderate pain. 40 tablet 0   No current facility-administered medications for this visit.      Physical Exam:   BP 115/73 mmHg  Pulse 66  Resp 20  Ht 5\' 11"  (1.803 m)  Wt 150 lb (68.04 kg)  BMI 20.93 kg/m2  SpO2 97%  General:  Well-appearing  Chest:   Clear  CV:   Regular rate and rhythm  Incisions:  Chest tube in place, intact  Abdomen:  Soft  Extremities:  Warm  Diagnostic Tests:  CHEST 2 VIEW  COMPARISON: 02/21/2014.  FINDINGS: Left chest tube in stable position. Interim resolution of left pneumothorax. Lungs are clear. Apical pleural thickening noted suggesting scarring. Heart size normal. No acute bony abnormality  IMPRESSION: 1. Chest tube in stable position. 2. Interim clearing of left apical pneumothorax.   Electronically Signed  By: Marcello Moores Register  On:  03/04/2014 11:27    Impression:  The patient appears clinically stable with no residual pneumothorax on repeat chest x-ray today.   Plan:  The patient's chest tube was removed uneventfully in the office today. He will undergo repeat chest x-ray now and return in 1 week with another repeat chest x-ray.  He has been advised to avoid any strenuous physical activity over the next week. He has been given a refill prescription for oral pain relievers.  I spent in excess of 15 minutes during the conduct of this office consultation and >50% of this time involved direct face-to-face encounter with the patient for counseling and/or coordination of their care.    Valentina Gu. Roxy Manns, MD 03/04/2014 2:10 PM    Repeat CXR:  ADDENDUM REPORT: 03/04/2014 14:20  ADDENDUM: The above dictation is on a different patient.  Left follows is the patient's dictation.  Interim removal of left chest tube. Minimal left apical pneumothorax. Heart size normal. No pleural effusion. No acute bony abnormality .  ADDENDUM IMPRESSION: Interim removal of left chest tube. Minimal left apical pneumothorax. Critical Value/emergent results were called by telephone at the time of interpretation on 03/04/2014 at 2:11 pm to Dr. Darylene Price , who verbally acknowledged these results.   Electronically Signed  By: Marcello Moores Register  On: 03/04/2014 14:20

## 2014-03-06 ENCOUNTER — Other Ambulatory Visit: Payer: Self-pay | Admitting: Thoracic Surgery (Cardiothoracic Vascular Surgery)

## 2014-03-06 DIAGNOSIS — J9383 Other pneumothorax: Secondary | ICD-10-CM

## 2014-03-08 ENCOUNTER — Other Ambulatory Visit: Payer: Self-pay

## 2014-03-08 DIAGNOSIS — J9383 Other pneumothorax: Secondary | ICD-10-CM

## 2014-03-11 ENCOUNTER — Encounter: Payer: Self-pay | Admitting: Thoracic Surgery (Cardiothoracic Vascular Surgery)

## 2014-03-11 ENCOUNTER — Ambulatory Visit
Admission: RE | Admit: 2014-03-11 | Discharge: 2014-03-11 | Disposition: A | Discharge status: Home / Self Care | Payer: No Typology Code available for payment source | Source: Ambulatory Visit | Attending: Thoracic Surgery (Cardiothoracic Vascular Surgery) | Admitting: Thoracic Surgery (Cardiothoracic Vascular Surgery)

## 2014-03-11 ENCOUNTER — Ambulatory Visit (INDEPENDENT_AMBULATORY_CARE_PROVIDER_SITE_OTHER): Payer: Self-pay | Admitting: Thoracic Surgery (Cardiothoracic Vascular Surgery)

## 2014-03-11 VITALS — BP 128/83 | HR 69 | Resp 20 | Ht 71.0 in | Wt 150.0 lb

## 2014-03-11 DIAGNOSIS — J9383 Other pneumothorax: Secondary | ICD-10-CM

## 2014-03-11 NOTE — Patient Instructions (Signed)
Patient may resume unrestricted physical activity without any particular limitations at this time.  The patient should make every effort to stop smoking immediately and permanently.

## 2014-03-11 NOTE — Progress Notes (Signed)
      [address]       [place] [address]             [phone]     CARDIOTHORACIC SURGERY OFFICE NOTE  Referring Provider is No ref. provider found PCP is No primary care provider on file.   HPI:  Patient returns to the office today for follow-up of left spontaneous pneumothorax. He originally presented 02/17/2014 with nearly complete collapse of the left lung. He was treated with chest tube placement with uncomplicated reexpansion of the left lung. However, the patient had a small intermittent persistent air leak for several days. After discussing options the patient elected to go home with a chest tube in place.  He was last seen in the office on 03/04/2014 at which time his chest tube was removed.  Since then he has continued to do well and he returns for routine follow-up today. He reports no residual soreness in the chest. He has no shortness of breath. He continues to smoke cigarettes.   Current Outpatient Prescriptions  Medication Sig Dispense Refill  . nicotine (NICODERM CQ - DOSED IN MG/24 HOURS) 21 mg/24hr patch Place 1 patch (21 mg total) onto the skin daily. 28 patch 0  . oxyCODONE (OXY IR/ROXICODONE) 5 MG immediate release tablet Take 1 tablet (5 mg total) by mouth every 6 (six) hours as needed for moderate pain. 40 tablet 0   No current facility-administered medications for this visit.      Physical Exam:   BP 128/83 mmHg  Pulse 69  Resp 20  Ht 5\' 11"  (1.803 m)  Wt 150 lb (68.04 kg)  BMI 20.93 kg/m2  SpO2 99%  General:  Well-appearing  Chest:   Clear to auscultation with symmetrical breath sounds  CV:   Regular rate and rhythm  Incisions:  Healing nicely  Abdomen:  Soft and nontender  Extremities:  Warm and well-perfused  Diagnostic Tests:  CHEST 2 VIEW  COMPARISON: Two-view chest x-ray 03/04/2014.  FINDINGS: The heart size is normal. The tiny left apical pneumothorax seen on the prior study has essentially resolved. There is  some pleural thickening at the left apex without a significant volume of free air. The lungs are otherwise clear. The visualized soft tissues and bony thorax are unremarkable.  IMPRESSION: 1. Resolution of left-sided pneumothorax. 2. Mild pleural thickening at the lung apices, left greater than right.   Electronically Signed  By: Lawrence Santiago M.D.  On: 03/11/2014 09:43   Impression:  Patient has recovered uneventfully following chest tube placement for simple primary left spontaneous pneumothorax. His chest tube was removed over a week ago and he has remained stable with complete resolution of his pneumothorax. Unfortunately he continues to smoke cigarettes.  Plan:  The patient has been released to unrestricted physical activity. Specifically he may go back to work. He has been reminded regarding the need to quit smoking. We discussed the possibility of recurrent spontaneous pneumothorax. Symptoms and signs to be mindful of were discussed. All of his questions been addressed. In the future he will call and return to see [place] as needed.  I spent in excess of 15 minutes during the conduct of this office consultation and >50% of this time involved direct face-to-face encounter with the patient for counseling and/or coordination of their care.   Valentina Gu. Roxy Manns, MD 03/11/2014 10:25 AM

## 2014-08-01 ENCOUNTER — Ambulatory Visit
Admission: RE | Admit: 2014-08-01 | Discharge: 2014-08-01 | Disposition: A | Discharge status: Home / Self Care | Payer: No Typology Code available for payment source | Source: Ambulatory Visit | Attending: Thoracic Surgery (Cardiothoracic Vascular Surgery) | Admitting: Thoracic Surgery (Cardiothoracic Vascular Surgery)

## 2014-08-01 ENCOUNTER — Encounter: Payer: Self-pay | Admitting: Thoracic Surgery (Cardiothoracic Vascular Surgery)

## 2014-08-01 ENCOUNTER — Ambulatory Visit (INDEPENDENT_AMBULATORY_CARE_PROVIDER_SITE_OTHER): Payer: No Typology Code available for payment source | Admitting: Thoracic Surgery (Cardiothoracic Vascular Surgery)

## 2014-08-01 ENCOUNTER — Other Ambulatory Visit: Payer: Self-pay | Admitting: *Deleted

## 2014-08-01 VITALS — BP 146/91 | HR 69 | Resp 20 | Ht 71.0 in | Wt 145.0 lb

## 2014-08-01 DIAGNOSIS — J9383 Other pneumothorax: Secondary | ICD-10-CM | POA: Diagnosis not present

## 2014-08-01 NOTE — Progress Notes (Signed)
[address]       [place] [address]             [phone]     CARDIOTHORACIC SURGERY OFFICE NOTE  Referring Provider is No ref. provider found PCP is No primary care provider on file.   HPI:  Patient is a 29 year old male who is otherwise healthy but sustained a left primary spontaneous pneumothorax in November 2015 that was treated with chest tube placement. The chest tube was removed uneventfully after successful complete reexpansion of the left lung and the patient was last seen in follow-up on 03/11/2014 at which time he was doing well. He remained in his usual state of health until early this morning when he developed sudden onset of similar left sided chest discomfort similar to the pain he had at the time of his first spontaneous pneumothorax. He states that the pain isn't as severe this time, and he has no associated shortness of breath. He denies any history of recent trauma. He has not had a cough recently and he otherwise has been feeling well. He does continue to smoke cigarettes.  The remainder of the patient's review of systems is unremarkable and his past medical history is unchanged.   Current Outpatient Prescriptions  Medication Sig Dispense Refill  . zolpidem (AMBIEN) 10 MG tablet Take 10 mg by mouth daily.   1   No current facility-administered medications for this visit.      Physical Exam:   BP 146/91 mmHg  Pulse 69  Resp 20  Ht 5\' 11"  (1.803 m)  Wt 145 lb (65.772 kg)  BMI 20.23 kg/m2  SpO2 98%  General:  Well-appearing  Chest:   Clear with symmetrical breath sounds  CV:   Regular rate and rhythm  Incisions:  n/a  Abdomen:  Soft and nontender  Extremities:  Warm and well-perfused  Diagnostic Tests:   CHEST - 2 VIEW  COMPARISON: 03/11/2014  FINDINGS: Small left apical and lateral pneumothorax, with apical pleural margin projecting below the posterior aspect left third rib. Right lung clear. No focal infiltrate. No  effusion. Heart size normal. Visualized skeletal structures are unremarkable.  IMPRESSION: 1. Small left pneumothorax.   Electronically Signed  By: Lucrezia Europe M.D.  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  The patient has mild associated symptoms and no shortness of breath.   Plan:  I discussed the nature of this problem at length with the patient and his wife. Because his pneumothorax has recurred on the same side he may be at increased risk for additional recurrent spontaneous pneumothorax in the future. However, this pneumothorax is quite small and as such does not demand any type of surgical intervention at this time.  In addition, chest CT scan performed last November did not reveal large blebs within the lung parenchyma.  Options include close observation versus proceeding with more definitive surgical intervention.  We plan to watch the patient closely for the time being with intent of holding off on any type of surgical intervention is possible. The patient will return to the office tomorrow for follow-up chest x-ray. He has been instructed to call EMS or go directly to the emergency department if he develops increased shortness of breath. He has been instructed that he should not drive nor mobile or return to work for the time being. He has been reminded how important will be for him to quit  smoking. All of his questions and been addressed.  I spent in excess of 15 minutes during the conduct of this office consultation and >50% of this time involved direct face-to-face encounter with the patient for counseling and/or coordination of their care.   Valentina Gu. Roxy Manns, MD 08/01/2014 10:59 AM

## 2014-08-01 NOTE — Patient Instructions (Signed)
No driving  No strenuous physical activity whatsoever  Go directly to the emergency room or call EMS if you develop severe shortness of breath

## 2014-08-02 ENCOUNTER — Ambulatory Visit (INDEPENDENT_AMBULATORY_CARE_PROVIDER_SITE_OTHER): Payer: No Typology Code available for payment source | Admitting: Thoracic Surgery (Cardiothoracic Vascular Surgery)

## 2014-08-02 ENCOUNTER — Ambulatory Visit
Admission: RE | Admit: 2014-08-02 | Discharge: 2014-08-02 | Disposition: A | Discharge status: Home / Self Care | Payer: No Typology Code available for payment source | Source: Ambulatory Visit | Attending: Thoracic Surgery (Cardiothoracic Vascular Surgery) | Admitting: Thoracic Surgery (Cardiothoracic Vascular Surgery)

## 2014-08-02 ENCOUNTER — Encounter: Payer: Self-pay | Admitting: Thoracic Surgery (Cardiothoracic Vascular Surgery)

## 2014-08-02 VITALS — BP 135/90 | HR 60 | Resp 20 | Ht 71.0 in | Wt 145.0 lb

## 2014-08-02 DIAGNOSIS — J9383 Other pneumothorax: Secondary | ICD-10-CM | POA: Diagnosis not present

## 2014-08-02 NOTE — Progress Notes (Signed)
      [address]       [place] [address]             [phone]     CARDIOTHORACIC SURGERY OFFICE NOTE  Referring Provider is No ref. provider found PCP is No primary care provider on file.   HPI:  Patient returns for follow-up of small recurrent left spontaneous pneumothorax. He presented yesterday morning with sudden onset left-sided chest pain without shortness of breath. Chest x-ray revealed a very small (less than 10%) apical pneumothorax. He returns to the office today for follow-up. He reports that overnight he has been feeling better. He still has some mild left-sided chest discomfort. He denies shortness of breath.   Current Outpatient Prescriptions  Medication Sig Dispense Refill  . zolpidem (AMBIEN) 10 MG tablet Take 10 mg by mouth daily.   1   No current facility-administered medications for this visit.      Physical Exam:   BP 135/90 mmHg  Pulse 60  Resp 20  Ht 5\' 11"  (1.803 m)  Wt 145 lb (65.772 kg)  BMI 20.23 kg/m2  SpO2 98%  General:  Well-appearing  Chest:   Clear  CV:   Regular rate and rhythm  Incisions:  n/a  Abdomen:  Soft  Extremities:  Warm  Diagnostic Tests:  CHEST 2 VIEW  COMPARISON: PA and lateral chest x-ray of August 01, 2014  FINDINGS: The small left apical pneumothorax is again demonstrated. It is unchanged overlying the third and fourth posterior rib interspace. The lungs are hyperinflated. The heart and pulmonary vascularity are normal. The mediastinum is normal in width. There is no pleural effusion. The bony thorax is unremarkable.  IMPRESSION: Stable small left apical pneumothorax amounting to 10% or less of the lung volume.   Electronically Signed  By: David Martinique M.D.  On: 08/02/2014 09:20   Impression:  Stable very small (less than 10%) recurrent left apical pneumothorax.   Plan:  We will continue observational therapy. The patient has been reminded to avoid any sort of  strenuous physical activity or heavy lifting. He will return for follow-up on Monday, 08/05/2014 for repeat chest x-ray.  I spent in excess of 10 minutes during the conduct of this office consultation and >50% of this time involved direct face-to-face encounter with the patient for counseling and/or coordination of their care.  Valentina Gu. Roxy Manns, MD 08/02/2014 9:45 AM

## 2014-08-05 ENCOUNTER — Other Ambulatory Visit: Payer: Self-pay | Admitting: Thoracic Surgery (Cardiothoracic Vascular Surgery)

## 2014-08-05 ENCOUNTER — Ambulatory Visit (INDEPENDENT_AMBULATORY_CARE_PROVIDER_SITE_OTHER): Payer: No Typology Code available for payment source | Admitting: Physician Assistant

## 2014-08-05 ENCOUNTER — Ambulatory Visit
Admission: RE | Admit: 2014-08-05 | Discharge: 2014-08-05 | Disposition: A | Discharge status: Home / Self Care | Payer: No Typology Code available for payment source | Source: Ambulatory Visit | Attending: Thoracic Surgery (Cardiothoracic Vascular Surgery) | Admitting: Thoracic Surgery (Cardiothoracic Vascular Surgery)

## 2014-08-05 VITALS — BP 136/90 | HR 60 | Resp 20 | Ht 71.0 in | Wt 145.0 lb

## 2014-08-05 DIAGNOSIS — J9383 Other pneumothorax: Secondary | ICD-10-CM

## 2014-08-05 DIAGNOSIS — Z720 Tobacco use: Secondary | ICD-10-CM

## 2014-08-05 MED ORDER — NICOTINE 14 MG/24HR TD PT24: 14.0000 mg | MEDICATED_PATCH | TRANSDERMAL | Status: DC

## 2014-08-05 MED ORDER — NICOTINE 14 MG/24HR TD PT24: 14.0000 mg | MEDICATED_PATCH | Freq: Every day | TRANSDERMAL | Status: DC

## 2014-08-05 NOTE — Progress Notes (Signed)
       [address]       [place] [address]             [phone]          HPI: The patient returns today for follow up of a recurrent left spontaneous pneumothorax.  He initially presented over a week ago with sudden onset left-sided chest pain without shortness of breath. Chest x-ray revealed a very small (less than 10%) apical pneumothorax,and this was managed conservatively.  He has done well since his last visit, and denies any chest pain or shortness of breath.  He continues to smoke, although he has cut back from 2 packs per day to between 15 cigarettes-1 ppd and desires to quit altogether.    Current Outpatient Prescriptions  Medication Sig Dispense Refill  . zolpidem (AMBIEN) 10 MG tablet Take 10 mg by mouth daily.   1  . nicotine (NICODERM CQ) 14 mg/24hr patch Place 1 patch (14 mg total) onto the skin daily. 28 patch 0  . nicotine (NICODERM CQ) 14 mg/24hr patch Place 1 patch (14 mg total) onto the skin daily. 28 patch 0   No current facility-administered medications for this visit.     Physical Exam: BP 136/90 HR 60 Resp 20 Heart: regular rate and rhythm Lungs: Clear to auscultation    Diagnostic Tests: Chest xray: Dg Chest 2 View  08/05/2014   CLINICAL DATA:  Follow-up of left-sided spontaneous pneumothorax, asymptomatic today  EXAM: CHEST  2 VIEW  COMPARISON:  PA and lateral chest x-ray of August 02, 2014  FINDINGS: The lungs remain hyperinflated. There has been further interval decrease in the size of the small left apical pneumothorax. It measures less than 5% of the lung volume today. There is no right-sided pneumothorax. There is a small amount of apical pleural thickening bilaterally. The heart and mediastinal structures are normal. There is no pleural effusion. The bony thorax is unremarkable.  IMPRESSION: There is a tiny residual left apical pneumothorax amounting to less than 5% of the lung volume.   Electronically Signed   By: David  Martinique  M.D.   On: 08/05/2014 14:28       Assessment/Plan: Review of the patient's chest x-ray today reveals near complete resolution of his left sided pneumothorax. He may resume his normal activity and may return to work. He is well aware of the symptoms to look for in the event that his pneumothorax recurs, and we will see him in the office as needed.  I have counseled him regarding the importance of smoking cessation today and he was given a prescription for Nicoderm 14 mg patches.  He has cut back significantly, and desires to quit.

## 2015-07-13 ENCOUNTER — Emergency Department (HOSPITAL_COMMUNITY)
Admission: EM | Admit: 2015-07-13 | Discharge: 2015-07-13 | Disposition: A | Discharge status: Home / Self Care | Payer: Medicaid Other | Attending: Emergency Medicine | Admitting: Emergency Medicine

## 2015-07-13 ENCOUNTER — Emergency Department (HOSPITAL_COMMUNITY): Payer: Medicaid Other

## 2015-07-13 ENCOUNTER — Encounter (HOSPITAL_COMMUNITY): Payer: Self-pay | Admitting: Emergency Medicine

## 2015-07-13 DIAGNOSIS — F17200 Nicotine dependence, unspecified, uncomplicated: Secondary | ICD-10-CM | POA: Insufficient documentation

## 2015-07-13 DIAGNOSIS — R0602 Shortness of breath: Secondary | ICD-10-CM | POA: Diagnosis present

## 2015-07-13 DIAGNOSIS — J9383 Other pneumothorax: Secondary | ICD-10-CM | POA: Diagnosis not present

## 2015-07-13 DIAGNOSIS — Z720 Tobacco use: Secondary | ICD-10-CM

## 2015-07-13 MED ORDER — IBUPROFEN 800 MG PO TABS: 800.0000 mg | ORAL_TABLET | Freq: Once | ORAL | Status: DC

## 2015-07-13 NOTE — ED Notes (Signed)
Pt sts hx of pneumothorax x 2 and thinks today he has same

## 2015-07-13 NOTE — ED Provider Notes (Signed)
CSN: [id number]     Arrival date & time 07/13/15  1859 History   First MD Initiated Contact with Patient 07/13/15 1913     Chief Complaint  Patient presents with  . Shortness of Breath     (Consider location/radiation/quality/duration/timing/severity/associated sxs/prior Treatment) Patient is a 30 y.o. male presenting with chest pain. The history is provided by the patient.  Chest Pain Pain location:  L chest Pain quality: sharp and stabbing   Pain radiates to:  Does not radiate Pain radiates to the back: yes   Pain severity:  Moderate (5/10) Onset quality:  Sudden Duration:  2 hours Timing:  Intermittent Progression:  Waxing and waning (pleuritic) Chronicity:  Recurrent Context: breathing   Context comment:  While jumping around and playing at a trampoline park with his kids.  Relieved by:  None tried Worsened by:  Deep breathing Ineffective treatments:  None tried Associated symptoms: no abdominal pain, no anxiety, no back pain, no cough, no diaphoresis, no dizziness, no fever, no headache, no lower extremity edema, no nausea, no near-syncope, no palpitations, no shortness of breath, no syncope, not vomiting and no weakness   Risk factors: male sex and smoking   Risk factors: no coronary artery disease, no diabetes mellitus, no high cholesterol, no hypertension, no prior DVT/PE and no surgery     Past Medical History  Diagnosis Date  . Tobacco abuse 02/17/2014  . Spontaneous pneumothorax 02/17/2014    Left    Past Surgical History  Procedure Laterality Date  . Left chest tube placement      02/17/14   History reviewed. No pertinent family history. Social History  Substance Use Topics  . Smoking status: Current Every Day Smoker  . Smokeless tobacco: None  . Alcohol Use: Yes     Comment: occ    Review of Systems  Constitutional: Positive for activity change. Negative for fever, diaphoresis and appetite change.  HENT: Negative for congestion, rhinorrhea, sinus  pressure and sneezing.   Respiratory: Negative for cough, chest tightness, shortness of breath and wheezing.   Cardiovascular: Positive for chest pain. Negative for palpitations, syncope and near-syncope.  Gastrointestinal: Negative for nausea, vomiting and abdominal pain.  Musculoskeletal: Negative for back pain and neck pain.  Skin: Negative for rash and wound.  Neurological: Negative for dizziness, syncope, weakness and headaches.  All other systems reviewed and are negative.     Allergies  Morphine and related  Home Medications   Prior to Admission medications   Medication Sig Start Date End Date Taking? Authorizing Provider  nicotine (NICODERM CQ) 14 mg/24hr patch Place 1 patch (14 mg total) onto the skin daily. Patient not taking: Reported on 07/13/2015 08/05/14   Coolidge Breeze, PA-C  nicotine (NICODERM CQ) 14 mg/24hr patch Place 1 patch (14 mg total) onto the skin daily. Patient not taking: Reported on 07/13/2015 08/05/14   Coolidge Breeze, PA-C   BP 129/86 mmHg  Pulse 82  Temp(Src) 97.7 F (36.5 C) (Oral)  Resp 16  SpO2 98% Physical Exam  Constitutional: He is oriented to person, place, and time. He appears well-developed and well-nourished. No distress.  HENT:  Head: Normocephalic and atraumatic.  Nose: Nose normal.  Mouth/Throat: Oropharynx is clear and moist.  Eyes: Conjunctivae and EOM are normal. Pupils are equal, round, and reactive to light.  Neck: Normal range of motion. Neck supple.  Cardiovascular: Normal rate, regular rhythm, normal heart sounds and intact distal pulses.   Pulmonary/Chest: Effort normal and breath sounds normal. He  exhibits no tenderness.  Abdominal: Soft. He exhibits no distension. There is no tenderness.  Musculoskeletal: He exhibits no edema or tenderness.  Neurological: He is alert and oriented to person, place, and time. No cranial nerve deficit. Coordination normal.  Skin: Skin is warm and dry. He is not diaphoretic.  Nursing note and  vitals reviewed.   ED Course  Procedures (including critical care time) Labs Review Labs Reviewed - No data to display  Imaging Review Dg Chest Portable 1 View  07/13/2015  CLINICAL DATA:  Left-sided chest pain with history of previous spontaneous pneumothoraces EXAM: PORTABLE CHEST 1 VIEW COMPARISON:  08/05/2014 FINDINGS: Cardiac shadow is within normal limits. The lungs are well aerated bilaterally. A minimal left pneumothorax is noted laterally along the apex. There is less than 1 cm excursion identified. No bony abnormality is seen. No focal infiltrate is noted. IMPRESSION: Tiny left pneumothorax as described. Electronically Signed   By: Inez Catalina M.D.   On: 07/13/2015 19:20   I have personally reviewed and evaluated these images and lab results as part of my medical decision-making.   EKG Interpretation   Date/Time:  Sunday July 13 2015 19:04:07 EDT Ventricular Rate:  97 PR Interval:  152 QRS Duration: 76 QT Interval:  324 QTC Calculation: 411 R Axis:   98 Text Interpretation:  Sinus rhythm with marked sinus arrhythmia Rightward  axis Nonspecific T wave abnormality Confirmed by Maryan Rued  MD, Loree Fee  [phone]) on 07/13/2015 7:41:16 PM      MDM  30 y.o. male smoker with a hx of prior spontaneous PTX x2 who follows with CT surgery presents to the ED after he had the return of left sided pleuritic chest pain, similar to previous PTX in the past when he was jumping around playing with his kids at a trampoline park. He presents in NAD, with VSS, normal O2 sats speaking in full sentences, sitting comfortably. Physical exam reassuring, as above. CXR shows small PTX noted on left. EKG showed NSR with SA with no acute ST or t-wave abnormalities suggestive of ischemia. He was given NSAIDs for pain. His case was discussed with CT surgery on the phone who recommended he follow up in their clinic in the next few days to discuss surgical management. Given his very reassuring exam and small PTX,  feel that he is stable for discharge with strict return precautions. He was recommended to take it easy over the next few days, with limited lifting. He will be called by CT surgery to schedule close follow up. This plan was discussed with the patient at the bedside and he stated both understanding and agreement.    Final diagnoses:  Spontaneous pneumothorax  Tobacco abuse        Zenovia Jarred, DO 07/14/15 0221  Blanchie Dessert, MD 07/16/15 2235

## 2015-07-13 NOTE — Discharge Instructions (Signed)
Do not lift anything greater than 10 lbs and take it easy until you follow up with your CT surgeons.

## 2015-07-14 ENCOUNTER — Other Ambulatory Visit: Payer: Self-pay

## 2015-07-14 ENCOUNTER — Inpatient Hospital Stay: Admission: RE | Admit: 2015-07-14 | Payer: Self-pay | Source: Ambulatory Visit

## 2015-07-14 DIAGNOSIS — J939 Pneumothorax, unspecified: Secondary | ICD-10-CM

## 2015-07-16 ENCOUNTER — Ambulatory Visit (INDEPENDENT_AMBULATORY_CARE_PROVIDER_SITE_OTHER): Payer: No Typology Code available for payment source | Admitting: Cardiothoracic Surgery

## 2015-07-16 ENCOUNTER — Encounter: Payer: Self-pay | Admitting: Cardiothoracic Surgery

## 2015-07-16 ENCOUNTER — Other Ambulatory Visit: Payer: Self-pay | Admitting: *Deleted

## 2015-07-16 ENCOUNTER — Ambulatory Visit
Admission: RE | Admit: 2015-07-16 | Discharge: 2015-07-16 | Disposition: A | Discharge status: Home / Self Care | Payer: Medicaid Other | Source: Ambulatory Visit | Attending: Cardiothoracic Surgery | Admitting: Cardiothoracic Surgery

## 2015-07-16 ENCOUNTER — Other Ambulatory Visit (HOSPITAL_COMMUNITY): Payer: Self-pay | Admitting: *Deleted

## 2015-07-16 VITALS — BP 141/100 | HR 84 | Resp 16 | Ht 71.0 in | Wt 155.0 lb

## 2015-07-16 DIAGNOSIS — J939 Pneumothorax, unspecified: Secondary | ICD-10-CM

## 2015-07-16 DIAGNOSIS — J9383 Other pneumothorax: Secondary | ICD-10-CM

## 2015-07-16 NOTE — Pre-Procedure Instructions (Signed)
[facility]  07/16/2015      Your procedure is scheduled on Tuesday, July 22, 2015 at 7:30 AM.   Report to [facility] Entr[facility] "A" Admitting Office at 5:30 AM.   Call this number if you have problems the morning of surgery: [phone]   Any questions prior to day of surgery, please call [phone] between 8 & 4 PM.   Remember:  Do not eat food or drink liquids after midnight Monday, 07/21/15.    Do not wear jewelry.  Do not wear lotions, powders, or cologne.  You may NOT wear deodorant.  Men may shave face and neck.  Do not bring valuables to the hospital.  [facility] is not responsible for any belongings or valuables.  Contacts, dentures or bridgework may not be worn into surgery.  Leave your suitcase in the car.  After surgery it may be brought to your room.  For patients admitted to the hospital, discharge time will be determined by your treatment team.  Special instructions:  [facility] - Preparing for Surgery  Before surgery, you can play an important role.  Because skin is not sterile, your skin needs to be as free of germs as possible.  You can reduce the number of germs on you skin by washing with CHG (chlorahexidine gluconate) soap before surgery.  CHG is an antiseptic cleaner which kills germs and bonds with the skin to continue killing germs even after washing.  Please DO NOT use if you have an allergy to CHG or antibacterial soaps.  If your skin becomes reddened/irritated stop using the CHG and inform your nurse when you arrive at Short Stay.  Do not shave (including legs and underarms) for at least 48 hours prior to the first CHG shower.  You may shave your face.  Please follow these instructions carefully:   1.  Shower with CHG Soap the night before surgery and the                                morning of Surgery.  2.  If you choose to wash your hair, wash your hair first as usual with your       normal shampoo.  3.  After you shampoo, rinse  your hair and body thoroughly to remove the                      Shampoo.  4.  Use CHG as you would any other liquid soap.  You can apply chg directly       to the skin and wash gently with scrungie or a clean washcloth.  5.  Apply the CHG Soap to your body ONLY FROM THE NECK DOWN.        Do not use on open wounds or open sores.  Avoid contact with your eyes, ears, mouth and genitals (private parts).  Wash genitals (private parts) with your normal soap.  6.  Wash thoroughly, paying special attention to the area where your surgery        will be performed.  7.  Thoroughly rinse your body with warm water from the neck down.  8.  DO NOT shower/wash with your normal soap after using and rinsing off       the CHG Soap.  9.  Pat yourself dry with a clean towel.  10.  Wear clean pajamas.            11.  Place clean sheets on your bed the night of your first shower and do not        sleep with pets.  Day of Surgery  Do not apply any lotions/deodorants the morning of surgery.  Please wear clean clothes to the hospital.   Please read over the following fact sheets that you were given. Pain Booklet, Coughing and Deep Breathing, Blood Transfusion Information, MRSA Information and Surgical Site Infection Prevention

## 2015-07-16 NOTE — Progress Notes (Signed)
Patient ID: Brendan Mathis, male   DOB: [birth year], 30 y.o.   MRN: [id number]      [address]       [place] [address]             [phone]        Brendan Mathis Date of Birth: 1986/03/11  Referring: No ref. provider found Primary Care: No primary care provider on file.  Chief Complaint:    Chief Complaint  Patient presents with  . Follow-up    from recent ED visit for recurrent Left PTX with CXR  patient examined, recent chest x-rays reviewed, previous CT scan of chest personally reviewed and counseled with patient  History of Present Illness:     30 year old Caucasian male smoker with prior history of spontaneous left pneumothorax presents with recurrent symptoms of pain and shortness of breath and a left spontaneous pneumothorax. On his first chest x-ray 2 days ago it was approximately 5-10%. Now it is 10-15%. He has had no preceding history of trauma or heavy coughing or vomiting. No productive cough. He has had left pleural discomfort.  In 2015 the patient presented with a large left pneumothorax and a chest tube placed in the emergency department by Dr. 1. Is treated with chest tube drainage of spontaneous pneumothorax for about 2 weeks and tube was removed in the office. In April 2016 he had an episode of a small 5-10% apical left pneumothorax which resolved with observation. 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  The patient unfortunately continues to smoke one pack per day. He has no history of chronic lung disease bronchitis or COPD or asthma.   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   Zubrod Score: At the time of surgery this patient's most appropriate activity status/level should be described as: []     0    Normal activity, no symptoms []     1    Restricted in physical strenuous activity but  ambulatory, able to do out light work [x]     2    Ambulatory and capable of self care, unable to do work activities, up and about                 more than 50%  Of the time                            []     3    Only limited self care, in bed greater than 50% of waking hours []     4    Completely disabled, no self care, confined to bed or chair []     5    Moribund  Past Medical History  Diagnosis Date  . Tobacco abuse 02/17/2014  . Spontaneous pneumothorax 02/17/2014    Left     Past Surgical History  Procedure Laterality Date  . Left chest tube placement      02/17/14    History  Smoking status  . Current Every Day Smoker  Smokeless tobacco  . Not on file   History  Alcohol Use  . Yes    Comment: occ    Social History   Social History  . Marital Status: Married    Spouse Name: N/A  . Number of Children: N/A  . Years of Education: N/A   Occupational History  .  Not on file.   Social History Main Topics  . Smoking status: Current Every Day Smoker  . Smokeless tobacco: Not on file  . Alcohol Use: Yes     Comment: occ  . Drug Use: Not on file  . Sexual Activity: Not on file   Other Topics Concern  . Not on file   Social History Narrative    Allergies  Allergen Reactions  . Morphine And Related Nausea And Vomiting    No current outpatient prescriptions on file.   No current facility-administered medications for this visit.     (Not in a hospital admission)  No family history on file. Patient has strong family history of CAD. Sterile male members of the family including his father and uncle had MI at an early age.  Review of Systems:       Cardiac Review of Systems: Y or N  Chest Pain [ yes pleuritic   ]  Resting SOB [ no  ] Exertional SOB  [ yes ]  Orthopnea [place].Blacker  ]   Pedal Edema [ no  ]    Palpitations [place].Blacker  ] Syncope  [ no]   Presyncope [no   ]  General Review of Systems: [Y] = yes [  ]=no Constitional: recent weight change [  ]; anorexia [   ]; fatigue [  ]; nausea [  ]; night sweats [  ]; fever [  ]; or chills [  ]                                                               Dental: poor dentition[  ]; Last Dentist visit: greater than one year  Eye : blurred vision [  ]; diplopia [   ]; vision changes [  ];  Amaurosis fugax[  ]; Resp: cough [  ];  wheezing[  ];  hemoptysis[  ]; shortness of breath[yes  ]; paroxysmal nocturnal dyspnea[  ]; dyspnea on exertion[  ]; or orthopnea[  ];  GI:  gallstones[  ], vomiting[  ];  dysphagia[  ]; melena[  ];  hematochezia [  ]; heartburn[  ];   Hx of  Colonoscopy[  ]; GU: kidney stones [  ]; hematuria[  ];   dysuria [  ];  nocturia[  ];  history of     obstruction [  ]; urinary frequency [  ]             Skin: rash, swelling[  ];, hair loss[  ];  peripheral edema[  ];  or itching[  ]; Musculosketetal: myalgias[  ];  joint swelling[  ];  joint erythema[  ];  joint pain[  ];  back pain[  ];  Heme/Lymph: bruising[  ];  bleeding[  ];  anemia[  ];  Neuro: TIA[  ];  headaches[  ];  stroke[  ];  vertigo[  ];  seizures[  ];   paresthesias[  ];  difficulty walking[  ];  Psych:depression[  ]; anxiety[  ];  Endocrine: diabetes[  ];  thyroid dysfunction[  ];  Immunizations: Flu [  ]; Pneumococcal[  ];  Other:right-hand dominant, no history of thoracic trauma the left chest tube for spontaneous pneumothorax 2015  Physical Exam: BP 141/100 mmHg  Pulse 84  Resp 16  Ht 5\' 11"  (1.803 m)  Wt 155 lb (70.308 kg)  BMI 21.63 kg/m2       Physical Exam  General: young Caucasian male thin anxious but in no acute distress accompanied by his wife HEENT: Normocephalic pupils equal , dentition adequate Neck: Supple without JVD, adenopathy, or bruit Chest: Clear to auscultation, symmetrical breath sounds, no rhonchi, no tenderness             or deformity old chest tube scar left lateral chest wall Cardiovascular: Regular rate and rhythm, no murmur, no gallop, peripheral pulses             palpable in all  extremities Abdomen:  Soft, nontender, no palpable mass or organomegaly Extremities: Warm, well-perfused, no clubbing cyanosis edema or tenderness,              no venous stasis changes of the legs Rectal/GU: Deferred Neuro: Grossly non--focal and symmetrical throughout Skin: Clean and dry without rash or ulceration   Diagnostic Studies & Laboratory data:   Chest x-rays personally reviewed and counseled the patient CT scan of chest taken 2015 shows left apical blebs.  Recent Radiology Findings:   Dg Chest 2 View  07/16/2015  CLINICAL DATA:  History of left pneumothorax, some persistent pain, current smoker. EXAM: CHEST  2 VIEW COMPARISON:  Portable chest x-ray dated July 13, 2015. FINDINGS: The left-sided pneumothorax has increased in size and now amounts to 10-15% of the lung volume. There is no pleural effusion. The right lung is well-expanded and clear. The heart and pulmonary vascularity are normal. The bony thorax exhibits no acute abnormality. IMPRESSION: Slight interval increase in size of the left-sided pneumothorax such that it now amounts to 10% to 15% of the lung volume. Critical Value/emergent results were called by telephone at the time of interpretation on 07/16/2015 at 10:49 am to Marlana Latus, RN,, who verbally acknowledged these results. Electronically Signed   By: David  Martinique M.D.   On: 07/16/2015 10:51     I have independently reviewed the above radiologic studies.  Recent Lab Findings: Lab Results  Component Value Date   WBC 16.1* 02/18/2014   HGB 17.0 02/18/2014   HCT 48.2 02/18/2014   PLT 230 02/18/2014   GLUCOSE 115* 02/17/2014   NA 137 02/17/2014   K 4.1 02/17/2014   CL 99 02/17/2014   CREATININE 0.98 02/18/2014   BUN 12 02/17/2014   CO2 24 02/17/2014   Labs will be obtained when he presents for his preoperative evaluation including EKG.   Assessment / Plan:     30 year old Caucasian male presents now with his third episode of significant left  spontaneous pneumothorax. The first episode record chest tube placement for almost 2 weeks. The patient has had his significant pain with these episodes of spontaneous pneumothorax. I've recommended that he undergo left VATS for stapling of blebs and pleurodesis and he is very much in agreement with proceeding with surgery to avoid persistent recurrent left spontaneous pneumothorax.  I discussed the procedure left VATS stapling of blebs and pleurodesis in detail the patient and his wife. The procedure will be scheduled for Tuesday, April 18 at [facility] hospital. He is strongly advised cystoscopy prior to the procedure.       07/16/2015 11:41 AM

## 2015-07-17 ENCOUNTER — Encounter (HOSPITAL_COMMUNITY): Payer: Self-pay

## 2015-07-17 ENCOUNTER — Encounter (HOSPITAL_COMMUNITY)
Admission: RE | Admit: 2015-07-17 | Discharge: 2015-07-17 | Disposition: A | Discharge status: Home / Self Care | Payer: Medicaid Other | Source: Ambulatory Visit | Attending: Cardiothoracic Surgery | Admitting: Cardiothoracic Surgery

## 2015-07-17 VITALS — BP 147/89 | HR 69 | Temp 97.8°F | Resp 18 | Ht 72.0 in | Wt 146.0 lb

## 2015-07-17 DIAGNOSIS — Z0183 Encounter for blood typing: Secondary | ICD-10-CM | POA: Diagnosis not present

## 2015-07-17 DIAGNOSIS — Z01812 Encounter for preprocedural laboratory examination: Secondary | ICD-10-CM | POA: Diagnosis not present

## 2015-07-17 DIAGNOSIS — J939 Pneumothorax, unspecified: Secondary | ICD-10-CM | POA: Diagnosis not present

## 2015-07-17 HISTORY — DX: Restless legs syndrome: G25.81

## 2015-07-17 HISTORY — DX: Gastro-esophageal reflux disease without esophagitis: K21.9

## 2015-07-17 LAB — URINALYSIS, ROUTINE W REFLEX MICROSCOPIC
Bilirubin Urine: NEGATIVE
Glucose, UA: NEGATIVE mg/dL
Hgb urine dipstick: NEGATIVE
Ketones, ur: NEGATIVE mg/dL
Leukocytes, UA: NEGATIVE
Nitrite: NEGATIVE
Protein, ur: 100 mg/dL — AB
Specific Gravity, Urine: 1.035 — ABNORMAL HIGH (ref 1.005–1.030)
pH: 5.5 (ref 5.0–8.0)

## 2015-07-17 LAB — COMPREHENSIVE METABOLIC PANEL
ALT: 12 U/L — ABNORMAL LOW (ref 17–63)
AST: 20 U/L (ref 15–41)
Albumin: 4 g/dL (ref 3.5–5.0)
Alkaline Phosphatase: 53 U/L (ref 38–126)
Anion gap: 11 (ref 5–15)
BUN: 15 mg/dL (ref 6–20)
CO2: 22 mmol/L (ref 22–32)
Calcium: 9 mg/dL (ref 8.9–10.3)
Chloride: 105 mmol/L (ref 101–111)
Creatinine, Ser: 0.9 mg/dL (ref 0.61–1.24)
GFR calc Af Amer: >60 mL/min (ref >60)
GFR calc non Af Amer: >60 mL/min (ref >60)
Glucose, Bld: 127 mg/dL — ABNORMAL HIGH (ref 65–99)
Potassium: 4.4 mmol/L (ref 3.5–5.1)
Sodium: 138 mmol/L (ref 135–145)
Total Bilirubin: 0.6 mg/dL (ref 0.3–1.2)
Total Protein: 7.1 g/dL (ref 6.5–8.1)

## 2015-07-17 LAB — CBC
HCT: 44.3 % (ref 39.0–52.0)
Hemoglobin: 15.6 g/dL (ref 13.0–17.0)
MCH: 29.5 pg (ref 26.0–34.0)
MCHC: 35.2 g/dL (ref 30.0–36.0)
MCV: 83.7 fL (ref 78.0–100.0)
Platelets: 229 10*3/uL (ref 150–400)
RBC: 5.29 MIL/uL (ref 4.22–5.81)
RDW: 13.4 % (ref 11.5–15.5)
WBC: 8.2 10*3/uL (ref 4.0–10.5)

## 2015-07-17 LAB — URINE MICROSCOPIC-ADD ON: RBC / HPF: NONE SEEN RBC/hpf (ref 0–5)

## 2015-07-17 LAB — APTT: aPTT: 27 seconds (ref 24–37)

## 2015-07-17 LAB — SURGICAL PCR SCREEN
MRSA, PCR: NEGATIVE
Staphylococcus aureus: NEGATIVE

## 2015-07-17 LAB — PROTIME-INR
INR: 1.13 (ref 0.00–1.49)
Prothrombin Time: 14.7 seconds (ref 11.6–15.2)

## 2015-07-17 LAB — ABO/RH: ABO/RH(D): AB POS

## 2015-07-17 NOTE — Progress Notes (Signed)
Pt denies cardiac history. Has chest pain at times due to spontaneous pneumothorax (3rd one he has had).

## 2015-07-21 MED ORDER — DEXTROSE 5 % IV SOLN
1.5000 g | INTRAVENOUS | Status: AC
  Administered 2015-07-22: 1.5 g via INTRAVENOUS
  Filled 2015-07-21: qty 1.5

## 2015-07-21 NOTE — Anesthesia Preprocedure Evaluation (Signed)
Anesthesia Evaluation  Patient identified by MRN, date of birth, ID band Patient awake    Reviewed: Allergy & Precautions, H&P , NPO status , Patient's Chart, lab work & pertinent test results  History of Anesthesia Complications Negative for: history of anesthetic complications  Airway Mallampati: II  TM Distance: >3 FB Neck ROM: full    Dental no notable dental hx.    Pulmonary Current Smoker,    Pulmonary exam normal breath sounds clear to auscultation       Cardiovascular negative cardio ROS Normal cardiovascular exam Rhythm:regular Rate:Normal     Neuro/Psych negative neurological ROS     GI/Hepatic Neg liver ROS, GERD  ,  Endo/Other  negative endocrine ROS  Renal/GU negative Renal ROS     Musculoskeletal   Abdominal   Peds  Hematology negative hematology ROS (+)   Anesthesia Other Findings   Reproductive/Obstetrics negative OB ROS                             Anesthesia Physical Anesthesia Plan  ASA: II  Anesthesia Plan: General   Post-op Pain Management:    Induction: Intravenous  Airway Management Planned: Double Lumen EBT  Additional Equipment: Arterial line  Intra-op Plan:   Post-operative Plan: Extubation in OR  Informed Consent: I have reviewed the patients History and Physical, chart, labs and discussed the procedure including the risks, benefits and alternatives for the proposed anesthesia with the patient or authorized representative who has indicated his/her understanding and acceptance.   Dental Advisory Given  Plan Discussed with: Anesthesiologist, CRNA and Surgeon  Anesthesia Plan Comments: (Right radial A line if able, 2 PIVs, recommend size 39 and 41 double lumen ETT in room, likely use 39)        Anesthesia Quick Evaluation

## 2015-07-22 ENCOUNTER — Inpatient Hospital Stay (HOSPITAL_COMMUNITY): Payer: Medicaid Other | Admitting: Anesthesiology

## 2015-07-22 ENCOUNTER — Inpatient Hospital Stay (HOSPITAL_COMMUNITY): Payer: Medicaid Other

## 2015-07-22 ENCOUNTER — Encounter (HOSPITAL_COMMUNITY): Payer: Self-pay | Admitting: Certified Registered Nurse Anesthetist

## 2015-07-22 ENCOUNTER — Encounter (HOSPITAL_COMMUNITY)
Admission: RE | Disposition: A | Discharge status: Home / Self Care | Payer: Self-pay | Source: Ambulatory Visit | Attending: Cardiothoracic Surgery

## 2015-07-22 ENCOUNTER — Inpatient Hospital Stay (HOSPITAL_COMMUNITY)
Admission: RE | Admit: 2015-07-22 | Discharge: 2015-07-25 | DRG: 165 | Disposition: A | Discharge status: Home / Self Care | Payer: Medicaid Other | Source: Ambulatory Visit | Attending: Cardiothoracic Surgery | Admitting: Cardiothoracic Surgery

## 2015-07-22 DIAGNOSIS — F17210 Nicotine dependence, cigarettes, uncomplicated: Secondary | ICD-10-CM | POA: Diagnosis present

## 2015-07-22 DIAGNOSIS — J939 Pneumothorax, unspecified: Secondary | ICD-10-CM

## 2015-07-22 DIAGNOSIS — J9383 Other pneumothorax: Principal | ICD-10-CM | POA: Diagnosis present

## 2015-07-22 DIAGNOSIS — Z938 Other artificial opening status: Secondary | ICD-10-CM

## 2015-07-22 DIAGNOSIS — R0602 Shortness of breath: Secondary | ICD-10-CM

## 2015-07-22 HISTORY — PX: VIDEO ASSISTED THORACOSCOPY: SHX5073

## 2015-07-22 HISTORY — PX: STAPLING OF BLEBS: SHX6429

## 2015-07-22 LAB — BLOOD GAS, ARTERIAL
Acid-Base Excess: 1.4 mmol/L (ref 0.0–2.0)
Acid-Base Excess: 0.3 mmol/L (ref 0.0–2.0)
Bicarbonate: 25.8 mEq/L — ABNORMAL HIGH (ref 20.0–24.0)
Bicarbonate: 25.4 mEq/L — ABNORMAL HIGH (ref 20.0–24.0)
Drawn by: 44984
FIO2: 0.21
O2 Content: 2 L/min
O2 Saturation: 98.4 %
O2 Saturation: 98 %
Patient temperature: 98.6
Patient temperature: 98.6
TCO2: 27.1 mmol/L (ref 0–100)
TCO2: 26.9 mmol/L (ref 0–100)
pCO2 arterial: 42.8 mmHg (ref 35.0–45.0)
pCO2 arterial: 49.2 mmHg — ABNORMAL HIGH (ref 35.0–45.0)
pH, Arterial: 7.397 (ref 7.350–7.450)
pH, Arterial: 7.334 — ABNORMAL LOW (ref 7.350–7.450)
pO2, Arterial: 106 mmHg — ABNORMAL HIGH (ref 80.0–100.0)
pO2, Arterial: 108 mmHg — ABNORMAL HIGH (ref 80.0–100.0)

## 2015-07-22 LAB — GLUCOSE, CAPILLARY
Glucose-Capillary: 199 mg/dL — ABNORMAL HIGH (ref 65–99)
Glucose-Capillary: 209 mg/dL — ABNORMAL HIGH (ref 65–99)

## 2015-07-22 LAB — PREPARE RBC (CROSSMATCH)

## 2015-07-22 SURGERY — VIDEO ASSISTED THORACOSCOPY
Anesthesia: General | Site: Chest | Laterality: Left

## 2015-07-22 MED ORDER — KETOROLAC TROMETHAMINE 30 MG/ML IJ SOLN
INTRAMUSCULAR | Status: AC
  Filled 2015-07-22: qty 1

## 2015-07-22 MED ORDER — ACETAMINOPHEN 500 MG PO TABS
1000.0000 mg | ORAL_TABLET | Freq: Four times a day (QID) | ORAL | Status: DC
  Administered 2015-07-22 – 2015-07-25 (×8): 1000 mg via ORAL
  Filled 2015-07-22 (×9): qty 2

## 2015-07-22 MED ORDER — PHENYLEPHRINE 40 MCG/ML (10ML) SYRINGE FOR IV PUSH (FOR BLOOD PRESSURE SUPPORT)
PREFILLED_SYRINGE | INTRAVENOUS | Status: AC
  Filled 2015-07-22: qty 10

## 2015-07-22 MED ORDER — ROCURONIUM BROMIDE 100 MG/10ML IV SOLN
INTRAVENOUS | Status: DC | PRN
  Administered 2015-07-22 (×2): 40 mg via INTRAVENOUS
  Administered 2015-07-22: 10 mg via INTRAVENOUS

## 2015-07-22 MED ORDER — PROPOFOL 10 MG/ML IV BOLUS
INTRAVENOUS | Status: AC
  Filled 2015-07-22: qty 20

## 2015-07-22 MED ORDER — SUGAMMADEX SODIUM 200 MG/2ML IV SOLN
INTRAVENOUS | Status: DC | PRN
  Administered 2015-07-22: 100 mg via INTRAVENOUS

## 2015-07-22 MED ORDER — HYDROMORPHONE HCL 1 MG/ML IJ SOLN
INTRAMUSCULAR | Status: AC
  Filled 2015-07-22: qty 1

## 2015-07-22 MED ORDER — LACTATED RINGERS IV SOLN
INTRAVENOUS | Status: DC | PRN
  Administered 2015-07-22: 07:00:00 via INTRAVENOUS

## 2015-07-22 MED ORDER — LABETALOL HCL 5 MG/ML IV SOLN: 10.0000 mg | INTRAVENOUS | Status: DC | PRN

## 2015-07-22 MED ORDER — INSULIN ASPART 100 UNIT/ML ~~LOC~~ SOLN
0.0000 [IU] | Freq: Four times a day (QID) | SUBCUTANEOUS | Status: DC
  Administered 2015-07-22: 4 [IU] via SUBCUTANEOUS

## 2015-07-22 MED ORDER — PANTOPRAZOLE SODIUM 40 MG PO TBEC
40.0000 mg | DELAYED_RELEASE_TABLET | Freq: Every day | ORAL | Status: DC
  Administered 2015-07-22 – 2015-07-24 (×3): 40 mg via ORAL
  Filled 2015-07-22 (×3): qty 1

## 2015-07-22 MED ORDER — HYDROMORPHONE HCL 1 MG/ML IJ SOLN
0.2500 mg | INTRAMUSCULAR | Status: DC | PRN
  Administered 2015-07-22 (×4): 0.25 mg via INTRAVENOUS

## 2015-07-22 MED ORDER — FENTANYL CITRATE (PF) 100 MCG/2ML IJ SOLN
INTRAMUSCULAR | Status: DC | PRN
  Administered 2015-07-22: 100 ug via INTRAVENOUS
  Administered 2015-07-22: 50 ug via INTRAVENOUS
  Administered 2015-07-22: 100 ug via INTRAVENOUS
  Administered 2015-07-22 (×2): 50 ug via INTRAVENOUS
  Administered 2015-07-22: 100 ug via INTRAVENOUS
  Administered 2015-07-22 (×3): 50 ug via INTRAVENOUS

## 2015-07-22 MED ORDER — HYDROMORPHONE HCL 1 MG/ML IJ SOLN
INTRAMUSCULAR | Status: DC | PRN
  Administered 2015-07-22 (×2): 0.5 mg via INTRAVENOUS

## 2015-07-22 MED ORDER — MIDAZOLAM HCL 2 MG/2ML IJ SOLN
INTRAMUSCULAR | Status: AC
  Filled 2015-07-22: qty 2

## 2015-07-22 MED ORDER — ONDANSETRON HCL 4 MG/2ML IJ SOLN: 4.0000 mg | Freq: Once | INTRAMUSCULAR | Status: DC | PRN

## 2015-07-22 MED ORDER — POTASSIUM CHLORIDE 10 MEQ/50ML IV SOLN: 10.0000 meq | Freq: Every day | INTRAVENOUS | Status: DC | PRN

## 2015-07-22 MED ORDER — NICOTINE 21 MG/24HR TD PT24
21.0000 mg | MEDICATED_PATCH | Freq: Every day | TRANSDERMAL | Status: DC
  Administered 2015-07-22 – 2015-07-24 (×3): 21 mg via TRANSDERMAL
  Filled 2015-07-22 (×3): qty 1

## 2015-07-22 MED ORDER — BISACODYL 5 MG PO TBEC
10.0000 mg | DELAYED_RELEASE_TABLET | Freq: Every day | ORAL | Status: DC
  Administered 2015-07-23: 10 mg via ORAL
  Filled 2015-07-22 (×2): qty 2

## 2015-07-22 MED ORDER — EPHEDRINE SULFATE 50 MG/ML IJ SOLN
INTRAMUSCULAR | Status: AC
  Filled 2015-07-22: qty 1

## 2015-07-22 MED ORDER — OXYCODONE HCL 5 MG/5ML PO SOLN: 5.0000 mg | Freq: Once | ORAL | Status: DC | PRN

## 2015-07-22 MED ORDER — DEXAMETHASONE SODIUM PHOSPHATE 10 MG/ML IJ SOLN
INTRAMUSCULAR | Status: DC | PRN
  Administered 2015-07-22: 10 mg via INTRAVENOUS

## 2015-07-22 MED ORDER — MIDAZOLAM HCL 5 MG/5ML IJ SOLN
INTRAMUSCULAR | Status: DC | PRN
  Administered 2015-07-22 (×2): 1 mg via INTRAVENOUS

## 2015-07-22 MED ORDER — HEMOSTATIC AGENTS (NO CHARGE) OPTIME
TOPICAL | Status: DC | PRN
  Administered 2015-07-22: 1 via TOPICAL

## 2015-07-22 MED ORDER — LIDOCAINE HCL (CARDIAC) 20 MG/ML IV SOLN
INTRAVENOUS | Status: AC
  Filled 2015-07-22: qty 5

## 2015-07-22 MED ORDER — SUCCINYLCHOLINE CHLORIDE 20 MG/ML IJ SOLN
INTRAMUSCULAR | Status: AC
  Filled 2015-07-22: qty 1

## 2015-07-22 MED ORDER — OXYCODONE HCL 5 MG PO TABS: 10.0000 mg | ORAL_TABLET | Freq: Once | ORAL | Status: DC | PRN

## 2015-07-22 MED ORDER — NALOXONE HCL 0.4 MG/ML IJ SOLN
0.4000 mg | INTRAMUSCULAR | Status: DC | PRN
  Filled 2015-07-22: qty 1

## 2015-07-22 MED ORDER — SODIUM CHLORIDE 0.9 % IV SOLN: Freq: Once | INTRAVENOUS | Status: DC

## 2015-07-22 MED ORDER — LABETALOL HCL 5 MG/ML IV SOLN
INTRAVENOUS | Status: AC
  Filled 2015-07-22: qty 4

## 2015-07-22 MED ORDER — DEXAMETHASONE SODIUM PHOSPHATE 10 MG/ML IJ SOLN
INTRAMUSCULAR | Status: AC
  Filled 2015-07-22: qty 1

## 2015-07-22 MED ORDER — FENTANYL 40 MCG/ML IV SOLN
INTRAVENOUS | Status: AC
  Filled 2015-07-22: qty 25

## 2015-07-22 MED ORDER — SUCCINYLCHOLINE CHLORIDE 20 MG/ML IJ SOLN
INTRAMUSCULAR | Status: DC | PRN
  Administered 2015-07-22: 100 mg via INTRAVENOUS

## 2015-07-22 MED ORDER — SODIUM CHLORIDE 0.9% FLUSH: 9.0000 mL | INTRAVENOUS | Status: DC | PRN

## 2015-07-22 MED ORDER — SUGAMMADEX SODIUM 200 MG/2ML IV SOLN
INTRAVENOUS | Status: AC
  Filled 2015-07-22: qty 2

## 2015-07-22 MED ORDER — KCL IN DEXTROSE-NACL 20-5-0.45 MEQ/L-%-% IV SOLN
INTRAVENOUS | Status: AC
  Filled 2015-07-22: qty 1000

## 2015-07-22 MED ORDER — HYDROMORPHONE HCL 1 MG/ML IJ SOLN
INTRAMUSCULAR | Status: AC
  Administered 2015-07-22: 0.25 mg via INTRAVENOUS
  Filled 2015-07-22: qty 1

## 2015-07-22 MED ORDER — PROPOFOL 10 MG/ML IV BOLUS
INTRAVENOUS | Status: DC | PRN
  Administered 2015-07-22: 200 mg via INTRAVENOUS

## 2015-07-22 MED ORDER — FENTANYL 40 MCG/ML IV SOLN
INTRAVENOUS | Status: DC
  Administered 2015-07-22: 210 ug via INTRAVENOUS
  Administered 2015-07-22: 11:00:00 via INTRAVENOUS
  Administered 2015-07-23: 60 ug via INTRAVENOUS
  Administered 2015-07-23 (×2): 30 ug via INTRAVENOUS
  Administered 2015-07-23: 165 ug via INTRAVENOUS
  Administered 2015-07-23: 15 ug via INTRAVENOUS

## 2015-07-22 MED ORDER — DIPHENHYDRAMINE HCL 12.5 MG/5ML PO ELIX: 12.5000 mg | ORAL_SOLUTION | Freq: Four times a day (QID) | ORAL | Status: DC | PRN

## 2015-07-22 MED ORDER — FENTANYL CITRATE (PF) 250 MCG/5ML IJ SOLN
INTRAMUSCULAR | Status: AC
  Filled 2015-07-22: qty 5

## 2015-07-22 MED ORDER — ONDANSETRON HCL 4 MG/2ML IJ SOLN
INTRAMUSCULAR | Status: DC | PRN
  Administered 2015-07-22: 4 mg via INTRAVENOUS

## 2015-07-22 MED ORDER — ROCURONIUM BROMIDE 50 MG/5ML IV SOLN
INTRAVENOUS | Status: AC
  Filled 2015-07-22: qty 1

## 2015-07-22 MED ORDER — ONDANSETRON HCL 4 MG/2ML IJ SOLN: 4.0000 mg | Freq: Four times a day (QID) | INTRAMUSCULAR | Status: DC | PRN

## 2015-07-22 MED ORDER — SENNOSIDES-DOCUSATE SODIUM 8.6-50 MG PO TABS
1.0000 | ORAL_TABLET | Freq: Every day | ORAL | Status: DC
  Administered 2015-07-22: 1 via ORAL
  Filled 2015-07-22 (×2): qty 1

## 2015-07-22 MED ORDER — 0.9 % SODIUM CHLORIDE (POUR BTL) OPTIME
TOPICAL | Status: DC | PRN
  Administered 2015-07-22 (×2): 1000 mL

## 2015-07-22 MED ORDER — TRAMADOL HCL 50 MG PO TABS
50.0000 mg | ORAL_TABLET | Freq: Four times a day (QID) | ORAL | Status: DC | PRN
  Filled 2015-07-22: qty 2

## 2015-07-22 MED ORDER — TALC 5 G PL SUSR
INTRAPLEURAL | Status: AC
  Filled 2015-07-22: qty 5

## 2015-07-22 MED ORDER — OXYCODONE HCL 5 MG PO TABS
5.0000 mg | ORAL_TABLET | ORAL | Status: DC | PRN
  Administered 2015-07-24 (×2): 10 mg via ORAL
  Filled 2015-07-22 (×2): qty 2

## 2015-07-22 MED ORDER — ONDANSETRON HCL 4 MG/2ML IJ SOLN
4.0000 mg | Freq: Four times a day (QID) | INTRAMUSCULAR | Status: DC | PRN
  Filled 2015-07-22: qty 2

## 2015-07-22 MED ORDER — KETOROLAC TROMETHAMINE 30 MG/ML IJ SOLN
INTRAMUSCULAR | Status: DC | PRN
  Administered 2015-07-22: 30 mg via INTRAVENOUS

## 2015-07-22 MED ORDER — KCL IN DEXTROSE-NACL 20-5-0.9 MEQ/L-%-% IV SOLN
INTRAVENOUS | Status: DC
  Administered 2015-07-22: 18:00:00 via INTRAVENOUS
  Filled 2015-07-22 (×5): qty 1000

## 2015-07-22 MED ORDER — ONDANSETRON HCL 4 MG/2ML IJ SOLN
INTRAMUSCULAR | Status: AC
  Filled 2015-07-22: qty 2

## 2015-07-22 MED ORDER — LIDOCAINE HCL (CARDIAC) 20 MG/ML IV SOLN
INTRAVENOUS | Status: DC | PRN
  Administered 2015-07-22: 100 mg via INTRAVENOUS

## 2015-07-22 MED ORDER — KETOROLAC TROMETHAMINE 30 MG/ML IJ SOLN
30.0000 mg | Freq: Three times a day (TID) | INTRAMUSCULAR | Status: AC
  Administered 2015-07-22 – 2015-07-23 (×4): 30 mg via INTRAVENOUS
  Filled 2015-07-22 (×4): qty 1

## 2015-07-22 MED ORDER — ACETAMINOPHEN 160 MG/5ML PO SOLN
1000.0000 mg | Freq: Four times a day (QID) | ORAL | Status: DC
  Administered 2015-07-24: 1000 mg via ORAL
  Filled 2015-07-22: qty 40.6

## 2015-07-22 MED ORDER — DIPHENHYDRAMINE HCL 50 MG/ML IJ SOLN: 12.5000 mg | Freq: Four times a day (QID) | INTRAMUSCULAR | Status: DC | PRN

## 2015-07-22 MED ORDER — DEXTROSE 5 % IV SOLN
1.5000 g | Freq: Two times a day (BID) | INTRAVENOUS | Status: AC
  Administered 2015-07-22 – 2015-07-23 (×2): 1.5 g via INTRAVENOUS
  Filled 2015-07-22 (×2): qty 1.5

## 2015-07-22 SURGICAL SUPPLY — 74 items
BAG DECANTER FOR FLEXI CONT (MISCELLANEOUS) IMPLANT
BLADE SURG 11 STRL SS (BLADE) ×3 IMPLANT
CANISTER SUCTION 2500CC (MISCELLANEOUS) ×3 IMPLANT
CATH KIT ON Q 5IN SLV (PAIN MANAGEMENT) IMPLANT
CATH ROBINSON RED A/P 22FR (CATHETERS) IMPLANT
CATH THORACIC 28FR (CATHETERS) ×3 IMPLANT
CATH THORACIC 36FR (CATHETERS) IMPLANT
CATH THORACIC 36FR RT ANG (CATHETERS) IMPLANT
CONT SPEC 4OZ CLIKSEAL STRL BL (MISCELLANEOUS) ×9 IMPLANT
COVER SURGICAL LIGHT HANDLE (MISCELLANEOUS) ×3 IMPLANT
DERMABOND ADVANCED (GAUZE/BANDAGES/DRESSINGS) ×4
DERMABOND ADVANCED .7 DNX12 (GAUZE/BANDAGES/DRESSINGS) ×2 IMPLANT
DRAPE LAPAROSCOPIC ABDOMINAL (DRAPES) ×3 IMPLANT
DRAPE WARM FLUID 44X44 (DRAPE) ×3 IMPLANT
ELECT BLADE 6.5 EXT (BLADE) ×3 IMPLANT
ELECT REM PT RETURN 9FT ADLT (ELECTROSURGICAL) ×3
ELECTRODE REM PT RTRN 9FT ADLT (ELECTROSURGICAL) ×1 IMPLANT
GAUZE SPONGE 4X4 12PLY STRL (GAUZE/BANDAGES/DRESSINGS) ×3 IMPLANT
GLOVE BIO SURGEON STRL SZ 6.5 (GLOVE) ×6 IMPLANT
GLOVE BIO SURGEON STRL SZ7.5 (GLOVE) ×6 IMPLANT
GLOVE BIO SURGEONS STRL SZ 6.5 (GLOVE) ×3
GLOVE BIOGEL PI IND STRL 6.5 (GLOVE) ×1 IMPLANT
GLOVE BIOGEL PI IND STRL 7.0 (GLOVE) ×2 IMPLANT
GLOVE BIOGEL PI IND STRL 8 (GLOVE) ×1 IMPLANT
GLOVE BIOGEL PI INDICATOR 6.5 (GLOVE) ×2
GLOVE BIOGEL PI INDICATOR 7.0 (GLOVE) ×4
GLOVE BIOGEL PI INDICATOR 8 (GLOVE) ×2
GLOVE SURG SS PI 7.0 STRL IVOR (GLOVE) ×6 IMPLANT
GOWN STRL REUS W/ TWL LRG LVL3 (GOWN DISPOSABLE) ×4 IMPLANT
GOWN STRL REUS W/ TWL XL LVL3 (GOWN DISPOSABLE) ×2 IMPLANT
GOWN STRL REUS W/TWL LRG LVL3 (GOWN DISPOSABLE) ×8
GOWN STRL REUS W/TWL XL LVL3 (GOWN DISPOSABLE) ×4
KIT BASIN OR (CUSTOM PROCEDURE TRAY) ×3 IMPLANT
KIT ROOM TURNOVER OR (KITS) ×3 IMPLANT
KIT SUCTION CATH 14FR (SUCTIONS) ×3 IMPLANT
NS IRRIG 1000ML POUR BTL (IV SOLUTION) ×6 IMPLANT
PACK CHEST (CUSTOM PROCEDURE TRAY) ×3 IMPLANT
PAD ARMBOARD 7.5X6 YLW CONV (MISCELLANEOUS) ×6 IMPLANT
RELOAD GOLD ECHELON 45 (STAPLE) ×15 IMPLANT
SEALANT SURG COSEAL 4ML (VASCULAR PRODUCTS) ×3 IMPLANT
SOLUTION ANTI FOG 6CC (MISCELLANEOUS) ×3 IMPLANT
SPONGE GAUZE 4X4 12PLY STER LF (GAUZE/BANDAGES/DRESSINGS) ×3 IMPLANT
SPONGE TONSIL 1 RF SGL (DISPOSABLE) ×3 IMPLANT
STAPLER ECHELON POWERED (MISCELLANEOUS) ×3 IMPLANT
SUT CHROMIC 3 0 SH 27 (SUTURE) IMPLANT
SUT ETHILON 3 0 PS 1 (SUTURE) IMPLANT
SUT PROLENE 3 0 SH DA (SUTURE) IMPLANT
SUT PROLENE 4 0 RB 1 (SUTURE)
SUT PROLENE 4-0 RB1 .5 CRCL 36 (SUTURE) IMPLANT
SUT SILK  1 MH (SUTURE) ×4
SUT SILK 1 MH (SUTURE) ×2 IMPLANT
SUT SILK 2 0SH CR/8 30 (SUTURE) IMPLANT
SUT SILK 3 0SH CR/8 30 (SUTURE) IMPLANT
SUT VIC AB 1 CTX 18 (SUTURE) IMPLANT
SUT VIC AB 2 TP1 27 (SUTURE) IMPLANT
SUT VIC AB 2-0 CT1 27 (SUTURE) ×2
SUT VIC AB 2-0 CT1 TAPERPNT 27 (SUTURE) ×1 IMPLANT
SUT VIC AB 2-0 CT2 18 VCP726D (SUTURE) IMPLANT
SUT VIC AB 2-0 CTX 36 (SUTURE) IMPLANT
SUT VIC AB 2-0 UR6 27 (SUTURE) ×12 IMPLANT
SUT VIC AB 3-0 SH 18 (SUTURE) IMPLANT
SUT VIC AB 3-0 X1 27 (SUTURE) ×9 IMPLANT
SUT VICRYL 0 UR6 27IN ABS (SUTURE) IMPLANT
SUT VICRYL 2 TP 1 (SUTURE) IMPLANT
SWAB COLLECTION DEVICE MRSA (MISCELLANEOUS) IMPLANT
SYSTEM SAHARA CHEST DRAIN ATS (WOUND CARE) ×3 IMPLANT
TAPE CLOTH SURG 4X10 WHT LF (GAUZE/BANDAGES/DRESSINGS) ×3 IMPLANT
TIP APPLICATOR SPRAY EXTEND 16 (VASCULAR PRODUCTS) IMPLANT
TOWEL OR 17X24 6PK STRL BLUE (TOWEL DISPOSABLE) ×3 IMPLANT
TOWEL OR 17X26 10 PK STRL BLUE (TOWEL DISPOSABLE) ×6 IMPLANT
TRAP SPECIMEN MUCOUS 40CC (MISCELLANEOUS) IMPLANT
TRAY FOLEY CATH 16FRSI W/METER (SET/KITS/TRAYS/PACK) ×3 IMPLANT
TUBE ANAEROBIC SPECIMEN COL (MISCELLANEOUS) IMPLANT
WATER STERILE IRR 1000ML POUR (IV SOLUTION) ×3 IMPLANT

## 2015-07-22 NOTE — Progress Notes (Signed)
Spoke with Joaquim Lai in respiratory who stated sample might be venous ( po2 35.6, o2 sat 68), CRNA was notified and will recollect  With [place].

## 2015-07-22 NOTE — Anesthesia Postprocedure Evaluation (Signed)
Anesthesia Post Note  Patient: Brendan Mathis  Procedure(s) Performed: Procedure(s) (LRB): LEFT VIDEO ASSISTED THORACOSCOPY (Left) LEFT STAPLING OF BLEBS (Left)  Patient location during evaluation: PACU Anesthesia Type: General Level of consciousness: awake and alert Pain management: pain level controlled Vital Signs Assessment: post-procedure vital signs reviewed and stable Respiratory status: spontaneous breathing, nonlabored ventilation, respiratory function stable and patient connected to nasal cannula oxygen Cardiovascular status: blood pressure returned to baseline and stable Postop Assessment: no signs of nausea or vomiting Anesthetic complications: no    Last Vitals:  Filed Vitals:   07/22/15 1133 07/22/15 1134  BP:    Pulse: 62 63  Temp:    Resp: 13 15    Last Pain:  Filed Vitals:   07/22/15 1134  PainSc: 4                  Zenaida Deed

## 2015-07-22 NOTE — Op Note (Signed)
Brendan Mathis, Brendan Mathis NO.:  [id number]  MEDICAL RECORD NO.:  [id number]  LOCATION:  [facility]                         FACILITY:  [facility]  PHYSICIAN:  Ivin Poot, M.D.  DATE OF BIRTH:  05/28/1985  DATE OF PROCEDURE:  07/22/2015 DATE OF DISCHARGE:                              OPERATIVE REPORT   OPERATIONS: 1. Left video-assisted thoracoscopic surgery, stapling of apical     blebs. 2. Mechanical pleurodesis of the left parietal pleura.  PREOPERATIVE DIAGNOSIS:  Recurrent spontaneous left pneumothorax.  POSTOPERATIVE DIAGNOSES:  Recurrent spontaneous left pneumothorax.  SURGEON:  Ivin Poot, M.D.  ASSISTANT:  Lars Pinks, PA-C.  ANESTHESIA:  General.  CLINICAL NOTE:  The patient is a 30 year old Caucasian male, smoker with history of recurrent pneumothorax.  Over a year ago, he had a spontaneous left pneumothorax, treated with chest tube.  Six-eight months later, he had a recurrent spontaneous pneumothorax, which was treated conservatively.  More recently, last week, he developed symptoms of a third recurrent left spontaneous pneumothorax and after re- examining the patient and reviewing his previous chest CT scan, left VATS for bleb stapling and pleurodesis was recommended.  The patient agreed to proceed with surgery after discussing the indications, benefits and alternatives.  We also discussed the risks of persistent air leak and recurrent pneumothorax, the risk of infection, the risk of bleeding.  The patient agreed to proceed under what I felt was an informed consent.  OPERATIVE PROCEDURE:  The patient was brought to the operating room and placed supine on the operating table.  General anesthesia was induced and a double-lumen endotracheal tube was positioned.  The patient was then turned to left side up and the left chest was prepped and draped as a sterile field.  A proper time-out was performed.  Small incisions were made around the  perimeter of the left chest in the anterior axillary line, just beneath the tip of the scapula, and posterior to the lateral aspect of the scapula.  The camera was inserted and there was poor collapse of the lung.  Maneuvers by the Anesthesia team, improved left lung collapse.  There were some adhesions at the apex.  These were taken down using the VATS instruments and cautery.  This allowed mobilization of the lung.  The upper lobe and superior segment of the lower lobe were carefully examined.  The upper lobe apex had blebs, the superior segment of the lower lobe did not have blebs present.  Using the Endo-GIA stapling devices, two wedge resections of the left lung apex were performed to remove the blebs.  The staple lines were covered with a light layer of CoSeal medical adhesive.  Hemostasis was adequate.  The patient then had mechanical pleurodesis of the upper half of his left hemithorax using a Bovie scratch pad.  Next, a 28-French chest tube was placed and positioned to the apex and secured to the skin.  Next, the lung was inflated under direct vision with the camera.  Next, the three VATS incisions were then closed in layers using Vicryl and sterile dressings were applied.  The patient was then rolled supine, extubated and returned to the recovery room.  The  chest tube had been connected to an underwater seal Pleur-evac system.     Ivin Poot, M.D.     PV/MEDQ  D:  07/22/2015  T:  07/22/2015  Job:  [id number]

## 2015-07-22 NOTE — Anesthesia Procedure Notes (Signed)
Procedure Name: Intubation Date/Time: 07/22/2015 7:45 AM Performed by: Rejeana Brock L Pre-anesthesia Checklist: Patient identified, Timeout performed, Emergency Drugs available, Suction available and Patient being monitored Patient Re-evaluated:Patient Re-evaluated prior to inductionOxygen Delivery Method: Circle system utilized Preoxygenation: Pre-oxygenation with 100% oxygen Intubation Type: IV induction Ventilation: Mask ventilation without difficulty Laryngoscope Size: Mac and 4 Grade View: Grade I Tube type: Oral Endobronchial tube: Left, Double lumen EBT, EBT position confirmed by auscultation and EBT position confirmed by fiberoptic bronchoscope and 39 Fr Number of attempts: 1 Airway Equipment and Method: Stylet and Fiberoptic brochoscope Placement Confirmation: ETT inserted through vocal cords under direct vision,  positive ETCO2 and breath sounds checked- equal and bilateral Secured at: 31 cm Tube secured with: Tape Dental Injury: Teeth and Oropharynx as per pre-operative assessment

## 2015-07-22 NOTE — Transfer of Care (Signed)
Immediate Anesthesia Transfer of Care Note  Patient: Brendan Mathis  Procedure(s) Performed: Procedure(s): LEFT VIDEO ASSISTED THORACOSCOPY (Left) LEFT STAPLING OF BLEBS (Left)  Patient Location: PACU  Anesthesia Type:General  Level of Consciousness: awake and alert   Airway & Oxygen Therapy: Patient Spontanous Breathing and Patient connected to face mask oxygen  Post-op Assessment: Report given to RN, Post -op Vital signs reviewed and stable and Patient moving all extremities X 4  Post vital signs: stable  Last Vitals:  Filed Vitals:   07/22/15 0554  BP: 130/77  Pulse: 61  Temp: 36.8 C  Resp: 18    Complications: No apparent anesthesia complications

## 2015-07-22 NOTE — Progress Notes (Signed)
The patient was examined and preop studies reviewed. There has been no change from the prior exam and the patient is ready for surgery.   Plan Left VATS and bleb resection on Minus Breeding

## 2015-07-22 NOTE — Brief Op Note (Addendum)
07/22/2015  9:57 AM  PATIENT:  Brendan Mathis  30 y.o. male  PRE-OPERATIVE DIAGNOSIS:  LEFT RECURRENT SPONTANEOUS PNEUMOTHORAX  POST-OPERATIVE DIAGNOSIS:  LEFT RECURRENT SPONTANEOUS PNEUMOTHORAX  PROCEDURE: LEFT LEFT VIDEO ASSISTED THORACOSCOPY, STAPLING OF BLEBS LEFT APEX, MECHANICAL PLEURODESIS  SURGEON:  Surgeon(s) and Role:    * Ivin Poot, MD - Primary  PHYSICIAN ASSISTANT: Lars Pinks PA-C  ANESTHESIA:   general  EBL:  Total I/O In: 75 [I.V.:900] Out: 180 [Urine:150; Blood:30]  BLOOD ADMINISTERED:none  DRAINS: 28 French  Chest Tube(s) in the left pleural space   SPECIMEN:  Source of Specimen:  Wedge (blebs) of left apex x 2  DISPOSITION OF SPECIMEN:  PATHOLOGY  COUNTS CORRECT:  YES  DICTATION: .Dragon Dictation  PLAN OF CARE: Admit to inpatient   PATIENT DISPOSITION:  PACU - hemodynamically stable.   Delay start of Pharmacological VTE agent (>24hrs) due to surgical blood loss or risk of bleeding: yes

## 2015-07-22 NOTE — Progress Notes (Signed)
Pt awake and alert at this time; pt came from PACU without CO2 sensor attached to pt; pt informed of policy and encouraged to wear CO2 sensor; pt refusing to wear at this time; pt states he "wants to eat"; will cont. To monitor.  Brendan Mathis

## 2015-07-23 ENCOUNTER — Inpatient Hospital Stay (HOSPITAL_COMMUNITY): Payer: Medicaid Other

## 2015-07-23 ENCOUNTER — Encounter (HOSPITAL_COMMUNITY): Payer: Self-pay | Admitting: Cardiothoracic Surgery

## 2015-07-23 LAB — CBC
HCT: 37.4 % — ABNORMAL LOW (ref 39.0–52.0)
Hemoglobin: 12.9 g/dL — ABNORMAL LOW (ref 13.0–17.0)
MCH: 28.7 pg (ref 26.0–34.0)
MCHC: 34.5 g/dL (ref 30.0–36.0)
MCV: 83.3 fL (ref 78.0–100.0)
Platelets: 164 10*3/uL (ref 150–400)
RBC: 4.49 MIL/uL (ref 4.22–5.81)
RDW: 13.1 % (ref 11.5–15.5)
WBC: 15.4 10*3/uL — ABNORMAL HIGH (ref 4.0–10.5)

## 2015-07-23 LAB — BASIC METABOLIC PANEL
Anion gap: 9 (ref 5–15)
BUN: 5 mg/dL — ABNORMAL LOW (ref 6–20)
CO2: 25 mmol/L (ref 22–32)
Calcium: 8.7 mg/dL — ABNORMAL LOW (ref 8.9–10.3)
Chloride: 104 mmol/L (ref 101–111)
Creatinine, Ser: 0.78 mg/dL (ref 0.61–1.24)
GFR calc Af Amer: >60 mL/min (ref >60)
GFR calc non Af Amer: >60 mL/min (ref >60)
Glucose, Bld: 173 mg/dL — ABNORMAL HIGH (ref 65–99)
Potassium: 3.8 mmol/L (ref 3.5–5.1)
Sodium: 138 mmol/L (ref 135–145)

## 2015-07-23 LAB — POCT I-STAT 3, ART BLOOD GAS (G3+)
Acid-Base Excess: 1 mmol/L (ref 0.0–2.0)
Bicarbonate: 26.9 mEq/L — ABNORMAL HIGH (ref 20.0–24.0)
O2 Saturation: 96 %
Patient temperature: 98.6
TCO2: 28 mmol/L (ref 0–100)
pCO2 arterial: 45.6 mmHg — ABNORMAL HIGH (ref 35.0–45.0)
pH, Arterial: 7.379 (ref 7.350–7.450)
pO2, Arterial: 87 mmHg (ref 80.0–100.0)

## 2015-07-23 LAB — GLUCOSE, CAPILLARY: Glucose-Capillary: 102 mg/dL — ABNORMAL HIGH (ref 65–99)

## 2015-07-23 NOTE — Progress Notes (Signed)
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 Objective: Vital signs in last 24 hours: Temp:  [97.3 F (36.3 C)-98.8 F (37.1 C)] 98.8 F (37.1 C) (04/19 1349) Pulse Rate:  [45-74] 69 (04/19 1349) Cardiac Rhythm:  [-] Normal sinus rhythm (04/19 1351) Resp:  [11-19] 18 (04/19 1543) BP: (112-156)/(75-98) 141/97 mmHg (04/19 1349) SpO2:  [97 %-100 %] 100 % (04/19 1543) Arterial Line BP: (137-165)/(71-83) 158/79 mmHg (04/19 0500)  Hem stable odynamic parameters for last 24 hours:    Intake/Output from previous day: 04/18 0701 - 04/19 0700 In: 2917.7 [P.O.:240; I.V.:2627.7; IV Piggyback:50] Out: 3805 [Urine:3035; Emesis/NG output:400; Blood:30; Chest Tube:340] Intake/Output this shift:         Exam    General- alert and comfortable   Lungs- clear without rales, wheezes   Cor- regular rate and rhythm, no murmur , gallop   Abdomen- soft, non-tender   Extremities - warm, non-tender, minimal edema   Neuro- oriented, appropriate, no focal weakness   Lab Results:  Recent Labs  07/23/15 0400  WBC 15.4*  HGB 12.9*  HCT 37.4*  PLT 164   BMET:  Recent Labs  07/23/15 0400  NA 138  K 3.8  CL 104  CO2 25  GLUCOSE 173*  BUN 5*  CREATININE 0.78  CALCIUM 8.7*    PT/INR: No results for input(s): LABPROT, INR in the last 72 hours. ABG    Component Value Date/Time   PHART 7.379 07/23/2015 0355   HCO3 26.9* 07/23/2015 0355   TCO2 28 07/23/2015 0355   O2SAT 96.0 07/23/2015 0355   CBG (last 3)   Recent Labs  07/22/15 1635 07/22/15 2344 07/23/15 0830  GLUCAP 209* 199* 102*    Assessment/Plan: S/P Procedure(s) (LRB): LEFT VIDEO ASSISTED THORACOSCOPY (Left) LEFT STAPLING OF BLEBS (Left) Mobilize d/c tubes/lines Transfer to telemetry bed on 2 west   Chest tube water seal   LOS: 1 day    Tharon Aquas Trigt III 07/23/2015

## 2015-07-23 NOTE — Progress Notes (Signed)
Pt transferred to 2West via ambulation, vss, pt settled in bed, receiving RN at bedside, pt family at bedside.  Brendan Mathis S 1:35 PM

## 2015-07-23 NOTE — Progress Notes (Signed)
Patient stable throughout the night. A-line readings 20-30 greater than BP cuff. A-line discontinued this morning. Patient HR brady to the mid 40's, but patient states normal HR around 50. No cardiac symptoms noted. Patient remains on full dose fentanyl PCA pump. Patient active with IS/coughing/deep breathing. Refused insulin coverage throughout the night. See flowsheets for more information on VS and trends.

## 2015-07-24 ENCOUNTER — Inpatient Hospital Stay (HOSPITAL_COMMUNITY): Payer: Medicaid Other

## 2015-07-24 LAB — COMPREHENSIVE METABOLIC PANEL
ALT: 14 U/L — ABNORMAL LOW (ref 17–63)
AST: 26 U/L (ref 15–41)
Albumin: 3 g/dL — ABNORMAL LOW (ref 3.5–5.0)
Alkaline Phosphatase: 42 U/L (ref 38–126)
Anion gap: 10 (ref 5–15)
BUN: 7 mg/dL (ref 6–20)
CO2: 26 mmol/L (ref 22–32)
Calcium: 8.7 mg/dL — ABNORMAL LOW (ref 8.9–10.3)
Chloride: 107 mmol/L (ref 101–111)
Creatinine, Ser: 0.91 mg/dL (ref 0.61–1.24)
GFR calc Af Amer: >60 mL/min (ref >60)
GFR calc non Af Amer: >60 mL/min (ref >60)
Glucose, Bld: 95 mg/dL (ref 65–99)
Potassium: 4 mmol/L (ref 3.5–5.1)
Sodium: 143 mmol/L (ref 135–145)
Total Bilirubin: 0.3 mg/dL (ref 0.3–1.2)
Total Protein: 5.8 g/dL — ABNORMAL LOW (ref 6.5–8.1)

## 2015-07-24 LAB — CBC
HCT: 38 % — ABNORMAL LOW (ref 39.0–52.0)
Hemoglobin: 12.6 g/dL — ABNORMAL LOW (ref 13.0–17.0)
MCH: 28.2 pg (ref 26.0–34.0)
MCHC: 33.2 g/dL (ref 30.0–36.0)
MCV: 85 fL (ref 78.0–100.0)
Platelets: 170 10*3/uL (ref 150–400)
RBC: 4.47 MIL/uL (ref 4.22–5.81)
RDW: 13.6 % (ref 11.5–15.5)
WBC: 9.6 10*3/uL (ref 4.0–10.5)

## 2015-07-24 NOTE — Discharge Summary (Signed)
Physician Discharge Summary  Patient ID: Brendan Mathis MRN: [id number] DOB/AGE: Sep 18, 1985 30 y.o.  Admit date: 07/22/2015 Discharge date: 07/25/2015  Admission Diagnoses:Recurrent left spontaneous pneumothorax  Discharge Diagnoses:  Active Problems:   Recurrent spontaneous pneumothorax  Patient Active Problem List   Diagnosis Date Noted  . 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 Spontaneous pneumothorax 02/17/2014  . Tobacco abuse 02/17/2014    History of Present Illness:  30 year old Caucasian male smoker with prior history of spontaneous left pneumothorax presents with recurrent symptoms of pain and shortness of breath and a left spontaneous pneumothorax. On his first chest x-ray 2 days ago it was approximately 5-10%. Now it is 10-15%. He has had no preceding history of trauma or heavy coughing or vomiting. No productive cough. He has had left pleural discomfort.  In 2015 the patient presented with a large left pneumothorax and a chest tube placed in the emergency department and  treated with chest tube drainage of spontaneous pneumothorax for about 2 weeks and tube was removed in the office. In April 2016 he had an episode of a small 5-10% apical left pneumothorax which resolved with observation. 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  The patient unfortunately continues to smoke one pack per day. He has no history of chronic lung disease bronchitis or COPD or asthma.  The patient was admitted through the emergency department for further evaluation and management to include video-assisted thoracoscopy. The chest tube was monitored closely for drainage and air leak and was discontinued in a routine stepwise manner. Serial chest x-rays were obtained as well as. Clinically he is remain stable without significant shortness of breath and is tolerating routine pulmonary toilet. He is also tolerating routine activities. Incisions are healing well  without evidence of infection. He is tolerating diet. Oxygen has been weaned. Tentatively he is felt to be stable for discharge in the a.m. if no new issues arise and chest x-ray remains stable.  Discharged Condition: good  Hospital Course: The patient was admitted as a thoracic surgical service and scheduled for the procedure on 07/22/2015. He tolerated it well and was taken to the postanesthesia care unit in stable condition.  Postoperatively, the patient has progressed nicely.  Consults: None  Significant Diagnostic Studies: routine CXR/Lab  Treatments:  DATE OF PROCEDURE: 07/22/2015 DATE OF DISCHARGE:   OPERATIVE REPORT   OPERATIONS: 1. Left video-assisted thoracoscopic surgery, stapling of apical  blebs. 2. Mechanical pleurodesis of the left parietal pleura.  PREOPERATIVE DIAGNOSIS: Recurrent spontaneous left pneumothorax.  POSTOPERATIVE DIAGNOSES: Recurrent spontaneous left pneumothorax.  SURGEON: Ivin Poot, M.D.  ASSISTANT: Lars Pinks, PA-C.  ANESTHESIA: General. Discharge Exam: Blood pressure 142/88, pulse 85, temperature 98.2 F (36.8 C), temperature source Oral, resp. rate 16, height 6' (1.829 m), weight 146 lb (66.225 kg), SpO2 98 %.  Cardiovascular: RRR Pulmonary: Clear to auscultation bilaterally but slightly diminished left apex. Abdomen: Soft, non tender, bowel sounds present. Extremities: No ower extremity edema. Wounds: Clean and dry. No erythema or signs of infection. Back wound is draining slight serous drainage.  Disposition: 01-Home or Self Care     Medication List    TAKE these medications        nicotine 21 mg/24hr patch  Commonly known as:  NICODERM CQ - dosed in mg/24 hours  Place 1 patch (21 mg total) onto the skin daily.     oxyCODONE 5 MG immediate release tablet  Commonly known as:  Oxy IR/ROXICODONE  Take 1-2 tablets (5-10 mg  total) by mouth every 4 (four) hours as needed  for severe pain.       Follow-up Information    Follow up with Ivin Poot III, MD On 08/06/2015.   Specialty:  Cardiothoracic Surgery   Why:  PA/LAT CXR to be taken (at [facility] which is in the same building as Dr. Lucianne Lei Trigt's office) on 08/06/2015 at 1:45 pm;Appointment time is at 2:30 pm   Contact information:   [address] [place] [address] [phone]       Signed: Nani Skillern PA-C 07/25/2015, 8:07 AM

## 2015-07-24 NOTE — Progress Notes (Addendum)
      [address]       [place] [address]             [phone]      2 Days Post-Op Procedure(s) (LRB): LEFT VIDEO ASSISTED THORACOSCOPY (Left) LEFT STAPLING OF BLEBS (Left)   Subjective:  Mr. Schooley has no complaints.  He states he has some drainage from his chest tube sites.  Objective: Vital signs in last 24 hours: Temp:  [97.3 F (36.3 C)-98.8 F (37.1 C)] 98.8 F (37.1 C) (04/20 0503) Pulse Rate:  [57-74] 74 (04/20 0503) Cardiac Rhythm:  [-] Normal sinus rhythm (04/19 2002) Resp:  [12-183] 183 (04/20 0503) BP: (123-146)/(73-97) 146/92 mmHg (04/20 0503) SpO2:  [96 %-100 %] 97 % (04/20 0503)  Intake/Output from previous day: 04/19 0701 - 04/20 0700 In: 620 [P.O.:240; I.V.:330; IV Piggyback:50] Out: 445 [Urine:415; Chest Tube:30]  General appearance: alert, cooperative and no distress Heart: regular rate and rhythm Lungs: clear to auscultation bilaterally Abdomen: soft, non-tender; bowel sounds normal; no masses,  no organomegaly Wound: clean and dry  Lab Results:  Recent Labs  07/23/15 0400 07/24/15 0205  WBC 15.4* 9.6  HGB 12.9* 12.6*  HCT 37.4* 38.0*  PLT 164 170   BMET:  Recent Labs  07/23/15 0400 07/24/15 0205  NA 138 143  K 3.8 4.0  CL 104 107  CO2 25 26  GLUCOSE 173* 95  BUN 5* 7  CREATININE 0.78 0.91  CALCIUM 8.7* 8.7*    PT/INR: No results for input(s): LABPROT, INR in the last 72 hours. ABG    Component Value Date/Time   PHART 7.379 07/23/2015 0355   HCO3 26.9* 07/23/2015 0355   TCO2 28 07/23/2015 0355   O2SAT 96.0 07/23/2015 0355   CBG (last 3)   Recent Labs  07/22/15 1635 07/22/15 2344 07/23/15 0830  GLUCAP 209* 199* 102*    Assessment/Plan: S/P Procedure(s) (LRB): LEFT VIDEO ASSISTED THORACOSCOPY (Left) LEFT STAPLING OF BLEBS (Left)  1. Chest tube- no air leak, CT on water seal without air leak- likely remove final chest tube today 2. Pulm- no acute issues, continue IS 3. Pain control- will  d/c PCA 4. Dispo- patient stable, likely d/c chest tube today, will likely be d/c in AM if CXR remains stable   LOS: 2 days    BARRETT, ERIN 07/24/2015  patient examined and medical record reviewed,agree with above note. Tharon Aquas Trigt III 07/24/2015

## 2015-07-24 NOTE — Progress Notes (Signed)
Chest tube removed. Minimal serous drainage. Tied suture in 8 knots, dressed with vaseline gauze, gauze, and abd pad. Taped on 3 sides. Pt. Tolerated well.

## 2015-07-24 NOTE — Discharge Instructions (Signed)
Thoracoscopy, Care After °Refer to this sheet in the next few weeks. These instructions provide you with information about caring for yourself after your procedure. Your health care provider may also give you more specific instructions. Your treatment has been planned according to current medical practices, but problems sometimes occur. Call your health care provider if you have any problems or questions after your procedure. °WHAT TO EXPECT AFTER THE PROCEDURE: °After your procedure, it is common to feel sore for up to two weeks. °HOME CARE INSTRUCTIONS °· There are many different ways to close and cover an incision, including stitches (sutures), skin glue, and adhesive strips. Follow your health care provider's instructions about: °¨ Incision care. °¨ Bandage (dressing) changes and removal. °¨ Incision closure removal. °· Check your incision area every day for signs of infection. Watch for: °¨ Redness, swelling, or pain. °¨ Fluid, blood, or pus. °· Take medicines only as directed by your health care provider. °· Try to cough often. Coughing helps to protect against lung infection (pneumonia). It may hurt to cough. If this happens, hold a pillow against your chest when you cough. °· Take deep breaths. This also helps to protect against pneumonia. °· If you were given an incentive spirometer, use it as directed by your health care provider. °· Do not take baths, swim, or use a hot tub until your health care provider approves. You may take showers. °· Avoid lifting until your health care provider approves. °· Avoid driving until your health care provider approves. °· Do not travel by airplane after the chest tube is removed until your health care provider approves. °SEEK MEDICAL CARE IF: °· You have a fever. °· Pain medicines do not ease your pain. °· You have redness, swelling, or increasing pain in your incision area. °· You develop a cough that does not go away, or you are coughing up mucus that is yellow or  green. °SEEK IMMEDIATE MEDICAL CARE IF: °· You have fluid, blood, or pus coming from your incision. °· There is a bad smell coming from your incision or dressing. °· You develop a rash. °· You have difficulty breathing. °· You cough up blood. °· You develop light-headedness or you feel faint. °· You develop chest pain. °· Your heartbeat feels irregular or very fast. °  °This information is not intended to replace advice given to you by your health care provider. Make sure you discuss any questions you have with your health care provider. °  °Document Released: 10/09/2004 Document Revised: 04/12/2014 Document Reviewed: 12/05/2013 °Elsevier Interactive Patient Education ©2016 Elsevier Inc. ° °

## 2015-07-24 NOTE — Progress Notes (Signed)
Pt. Has been ambulating frequently throughout the day with his wife. Walks independently on room air. Tolerates very well.

## 2015-07-25 ENCOUNTER — Inpatient Hospital Stay (HOSPITAL_COMMUNITY): Payer: Medicaid Other

## 2015-07-25 LAB — TYPE AND SCREEN
ABO/RH(D): AB POS
Antibody Screen: NEGATIVE
Unit division: 0
Unit division: 0

## 2015-07-25 MED ORDER — NICOTINE 21 MG/24HR TD PT24: 21.0000 mg | MEDICATED_PATCH | Freq: Every day | TRANSDERMAL | Status: DC

## 2015-07-25 MED ORDER — OXYCODONE HCL 5 MG PO TABS: 5.0000 mg | ORAL_TABLET | ORAL | Status: DC | PRN

## 2015-07-25 NOTE — Progress Notes (Signed)
Patient discharged home with girlfriend. Instructions were given and educated on. The patient and girlfriend stated they understood. IV was dc'd and was intact.

## 2015-07-25 NOTE — Progress Notes (Addendum)
      [address]       [name] [address]             [phone]       3 Days Post-Op Procedure(s) (LRB): LEFT VIDEO ASSISTED THORACOSCOPY (Left) LEFT STAPLING OF BLEBS (Left)  Subjective: Patient without complaints-wants to go home.  Objective: Vital signs in last 24 hours: Temp:  [97.9 F (36.6 C)-98.2 F (36.8 C)] 98.2 F (36.8 C) (04/21 0548) Pulse Rate:  [53-85] 85 (04/21 0548) Cardiac Rhythm:  [-] Normal sinus rhythm (04/20 1949) Resp:  [16] 16 (04/21 0548) BP: (122-142)/(77-88) 142/88 mmHg (04/21 0548) SpO2:  [98 %-100 %] 98 % (04/21 0548)      Intake/Output from previous day: 04/20 0701 - 04/21 0700 In: 840 [P.O.:840] Out: -    Physical Exam:  Cardiovascular: RRR Pulmonary: Clear to auscultation bilaterally but slightly diminished left apex. Abdomen: Soft, non tender, bowel sounds present. Extremities: No ower extremity edema. Wounds: Clean and dry.  No erythema or signs of infection. Back wound is draining slight serous drainage.  Lab Results: CBC: Recent Labs  07/23/15 0400 07/24/15 0205  WBC 15.4* 9.6  HGB 12.9* 12.6*  HCT 37.4* 38.0*  PLT 164 170   BMET:  Recent Labs  07/23/15 0400 07/24/15 0205  NA 138 143  K 3.8 4.0  CL 104 107  CO2 25 26  GLUCOSE 173* 95  BUN 5* 7  CREATININE 0.78 0.91  CALCIUM 8.7* 8.7*    PT/INR: No results for input(s): LABPROT, INR in the last 72 hours. ABG:  INR: Will add last result for INR, ABG once components are confirmed Will add last 4 CBG results once components are confirmed  Assessment/Plan:  1. CV - SR in the 80's. 2.  Pulmonary - CXR this am appears to show left apical pneumothorax, slightly increase from yesterday's CXR. 3. As discussed with Dr. Prescott Gum, discharge  ZIMMERMAN,DONIELLE MPA-C 07/25/2015,7:56 AM  patient examined and medical record reviewed,agree with above note. Tharon Aquas Trigt III 07/25/2015

## 2015-08-06 ENCOUNTER — Encounter: Payer: Self-pay | Admitting: Cardiothoracic Surgery

## 2015-08-06 ENCOUNTER — Ambulatory Visit (INDEPENDENT_AMBULATORY_CARE_PROVIDER_SITE_OTHER): Payer: Self-pay | Admitting: Cardiothoracic Surgery

## 2015-08-06 ENCOUNTER — Ambulatory Visit
Admission: RE | Admit: 2015-08-06 | Discharge: 2015-08-06 | Disposition: A | Discharge status: Home / Self Care | Payer: Medicaid Other | Source: Ambulatory Visit | Attending: Cardiothoracic Surgery | Admitting: Cardiothoracic Surgery

## 2015-08-06 VITALS — BP 140/88 | HR 74 | Resp 20 | Ht 72.0 in | Wt 148.0 lb

## 2015-08-06 DIAGNOSIS — J939 Pneumothorax, unspecified: Secondary | ICD-10-CM

## 2015-08-06 DIAGNOSIS — Z789 Other specified health status: Secondary | ICD-10-CM

## 2015-08-06 DIAGNOSIS — Z9689 Presence of other specified functional implants: Secondary | ICD-10-CM

## 2015-08-06 DIAGNOSIS — J9383 Other pneumothorax: Secondary | ICD-10-CM

## 2015-08-06 NOTE — Progress Notes (Signed)
PCP is Rochel Brome, MD Referring Provider is Lacretia Leigh, MD  Chief Complaint  Patient presents with  . Routine Post Op    2 week f/u from surgery with CXR s/p Lt VATS,stapling of apical Mechanical pleurodesis of the left parietal pleura on 07/22/15    [id number] postop visit after left VATS resection of blebs and pleurodesis for recurrent spontaneous left pneumothorax Patient's pain controlled with Aleve Complaining of insomnia Incisions healing well Nonsmoking   Past Medical History  Diagnosis Date  . Tobacco abuse 02/17/2014  . Spontaneous pneumothorax 02/17/2014    Left   . GERD (gastroesophageal reflux disease)     as a child  . Restless leg syndrome     Past Surgical History  Procedure Laterality Date  . Left chest tube placement      02/17/14  . Video assisted thoracoscopy Left 07/22/2015    Procedure: LEFT VIDEO ASSISTED THORACOSCOPY;  Surgeon: Ivin Poot, MD;  Location: [facility];  Service: Thoracic;  Laterality: Left;  . Stapling of blebs Left 07/22/2015    Procedure: LEFT STAPLING OF BLEBS;  Surgeon: Ivin Poot, MD;  Location: [facility] OR;  Service: Thoracic;  Laterality: Left;    Family History  Problem Relation Age of Onset  . Heart disease Father     Social History Social History  Substance Use Topics  . Smoking status: Current Every Day Smoker -- 1.00 packs/day    Types: Cigarettes  . Smokeless tobacco: Never Used  . Alcohol Use: Yes     Comment: occ    Current Outpatient Prescriptions  Medication Sig Dispense Refill  . nicotine (NICODERM CQ - DOSED IN MG/24 HOURS) 21 mg/24hr patch Place 1 patch (21 mg total) onto the skin daily. 28 patch 0  . oxyCODONE (OXY IR/ROXICODONE) 5 MG immediate release tablet Take 1-2 tablets (5-10 mg total) by mouth every 4 (four) hours as needed for severe pain. 30 tablet 0   No current facility-administered medications for this visit.    Allergies  Allergen Reactions  . Morphine And Related Nausea And  Vomiting    Review of Systems  Improved appetite and strength Anxious to resume driving in normal activities BP 140/88 mmHg  Pulse 74  Resp 20  Ht 6' (1.829 m)  Wt 148 lb (67.132 kg)  BMI 20.07 kg/m2  SpO2 98% Physical Exam   lungs clear Heart rate regular Incisions clean and dry Chest tube sutures removed Diagnostic Tests: Chest x-ray shows full reexpansion left lung without effusion or pneumothorax  Impression: Excellent recovery after left VATS for spontaneous pneumothorax bleb resection Patient may resume driving in normal activities He should not do heavy lifting or heavy exertional activities for 3 weeks. He was provided a short prescription for Ambien for his severe insomnia Plan:return as needed   Len Childs, MD [facility] [phone] Her and and and and of the

## 2015-12-06 ENCOUNTER — Emergency Department (HOSPITAL_COMMUNITY): Payer: Medicaid Other

## 2015-12-06 ENCOUNTER — Encounter (HOSPITAL_COMMUNITY): Payer: Self-pay | Admitting: Nurse Practitioner

## 2015-12-06 ENCOUNTER — Emergency Department (HOSPITAL_COMMUNITY)
Admission: EM | Admit: 2015-12-06 | Discharge: 2015-12-06 | Disposition: A | Discharge status: Home / Self Care | Payer: Medicaid Other | Attending: Emergency Medicine | Admitting: Emergency Medicine

## 2015-12-06 DIAGNOSIS — R071 Chest pain on breathing: Secondary | ICD-10-CM | POA: Diagnosis present

## 2015-12-06 DIAGNOSIS — R0781 Pleurodynia: Secondary | ICD-10-CM | POA: Diagnosis not present

## 2015-12-06 DIAGNOSIS — F17210 Nicotine dependence, cigarettes, uncomplicated: Secondary | ICD-10-CM | POA: Diagnosis not present

## 2015-12-06 DIAGNOSIS — R091 Pleurisy: Secondary | ICD-10-CM

## 2015-12-06 LAB — BASIC METABOLIC PANEL
Anion gap: 4 — ABNORMAL LOW (ref 5–15)
BUN: <5 mg/dL — ABNORMAL LOW (ref 6–20)
CO2: 29 mmol/L (ref 22–32)
Calcium: 9 mg/dL (ref 8.9–10.3)
Chloride: 105 mmol/L (ref 101–111)
Creatinine, Ser: 0.88 mg/dL (ref 0.61–1.24)
GFR calc Af Amer: >60 mL/min (ref >60)
GFR calc non Af Amer: >60 mL/min (ref >60)
Glucose, Bld: 99 mg/dL (ref 65–99)
Potassium: 3.5 mmol/L (ref 3.5–5.1)
Sodium: 138 mmol/L (ref 135–145)

## 2015-12-06 LAB — CBC
HCT: 43.3 % (ref 39.0–52.0)
Hemoglobin: 14.9 g/dL (ref 13.0–17.0)
MCH: 30.3 pg (ref 26.0–34.0)
MCHC: 34.4 g/dL (ref 30.0–36.0)
MCV: 88.2 fL (ref 78.0–100.0)
Platelets: 201 10*3/uL (ref 150–400)
RBC: 4.91 MIL/uL (ref 4.22–5.81)
RDW: 14.1 % (ref 11.5–15.5)
WBC: 9.2 10*3/uL (ref 4.0–10.5)

## 2015-12-06 LAB — D-DIMER, QUANTITATIVE (NOT AT ARMC): D-Dimer, Quant: 0.72 ug/mL-FEU — ABNORMAL HIGH (ref 0.00–0.50)

## 2015-12-06 LAB — I-STAT TROPONIN, ED: Troponin i, poc: 0 ng/mL (ref 0.00–0.08)

## 2015-12-06 MED ORDER — IOPAMIDOL (ISOVUE-370) INJECTION 76%
INTRAVENOUS | Status: AC
  Administered 2015-12-06: 100 mL
  Filled 2015-12-06: qty 100

## 2015-12-06 NOTE — ED Provider Notes (Signed)
[facility] DEPT Provider Note   CSN: [id number] Arrival date & time: 12/06/15  1502     History   Chief Complaint Chief Complaint  Patient presents with  . Chest Pain    HPI Brendan Mathis is a 30 y.o. male.  Patient reports acute onset of left-sided pleuritic chest pain 3 days ago. He noticed that he was having sharp pain over the left lower chest that was similar to pain he experienced with spontaneous pneumothoraces in the past. He reports this pain has persisted with coughing and deep inspiration over the last couple days but is minimally improved today. He does note that his he has some radiation of this pain, his spine. He denies any chest pressure. Denies any shortness of breath. No recent fevers, chills, abdominal pain, constipation, diarrhea, urinary symptoms, lower show any swelling, recent trauma, sick contacts. Patient continues to smoke but denies any recent increase in sputum production or quality.  Patient has had a VATS for his most recent pneumothorax in April. He has just recently returned to work, denies any significantly strenuous physical activity. He also describes a "popping" sensation is deep inspiration.     Past Medical History:  Diagnosis Date  . GERD (gastroesophageal reflux disease)    as a child  . Restless leg syndrome   . Spontaneous pneumothorax 02/17/2014   Left   . Tobacco abuse 02/17/2014    Patient Active Problem List   Diagnosis Date Noted  . 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 Spontaneous pneumothorax 02/17/2014  . Tobacco abuse 02/17/2014    Past Surgical History:  Procedure Laterality Date  . Left chest tube placement     02/17/14  . STAPLING OF BLEBS Left 07/22/2015   Procedure: LEFT STAPLING OF BLEBS;  Surgeon: Ivin Poot, MD;  Location: [facility];  Service: Thoracic;  Laterality: Left;  Marland Kitchen VIDEO ASSISTED THORACOSCOPY Left 07/22/2015   Procedure: LEFT VIDEO ASSISTED THORACOSCOPY;  Surgeon: Ivin Poot, MD;   Location: [facility];  Service: Thoracic;  Laterality: Left;    Home Medications    Prior to Admission medications   Medication Sig Start Date End Date Taking? Authorizing Provider  nicotine (NICODERM CQ - DOSED IN MG/24 HOURS) 21 mg/24hr patch Place 1 patch (21 mg total) onto the skin daily. 07/25/15   Donielle Liston Alba, PA-C  oxyCODONE (OXY IR/ROXICODONE) 5 MG immediate release tablet Take 1-2 tablets (5-10 mg total) by mouth every 4 (four) hours as needed for severe pain. 07/25/15   Donielle Liston Alba, PA-C    Family History Family History  Problem Relation Age of Onset  . Heart disease Father     Social History Social History  Substance Use Topics  . Smoking status: Current Every Day Smoker    Packs/day: 1.00    Types: Cigarettes  . Smokeless tobacco: Never Used  . Alcohol use Yes     Comment: occ     Allergies   Morphine and related   Review of Systems Review of Systems  Constitutional: Negative for chills, fatigue and fever.  HENT: Negative for congestion, ear pain, postnasal drip, sinus pressure and sore throat.   Eyes: Negative for pain and visual disturbance.  Respiratory: Negative for cough, shortness of breath and wheezing.   Cardiovascular: Negative for chest pain, palpitations and leg swelling.  Gastrointestinal: Negative for abdominal pain, constipation, diarrhea, nausea and vomiting.  Genitourinary: Negative for dysuria, flank pain and hematuria.  Musculoskeletal: Negative for arthralgias, back pain and myalgias.  Skin: Negative  for color change and rash.  Neurological: Negative for dizziness, syncope, weakness and light-headedness.  All other systems reviewed and are negative.    Physical Exam Updated Vital Signs BP 112/73   Pulse (!) 54   Temp 98.3 F (36.8 C) (Oral)   Resp 13   Ht 5\' 11"  (1.803 m)   Wt 65.2 kg   SpO2 99%   BMI 20.06 kg/m   Physical Exam  Constitutional: He appears well-developed and well-nourished. No distress.  HENT:    Head: Normocephalic and atraumatic.  Mouth/Throat: Oropharynx is clear and moist.  Eyes: Conjunctivae are normal. Pupils are equal, round, and reactive to light.  Neck: Normal range of motion. Neck supple.  Cardiovascular: Normal rate and regular rhythm.   No murmur heard. Pulmonary/Chest: Effort normal and breath sounds normal. No respiratory distress. He has no decreased breath sounds. He has no wheezes. He has no rhonchi. He has no rales. Chest wall is not dull to percussion. He exhibits no tenderness, no bony tenderness, no crepitus, no deformity and no retraction.  End-inspiratory "pop" heard diffusely over left lung fields. VATS incisional scars over the left midaxillary line and left lung bases posteriorly  Abdominal: Soft. There is no tenderness.  Musculoskeletal: He exhibits no edema, tenderness or deformity.  Neurological: He is alert.  Skin: Skin is warm and dry. He is not diaphoretic.  Psychiatric: He has a normal mood and affect.  Nursing note and vitals reviewed.  ED Treatments / Results  Labs (all labs ordered are listed, but only abnormal results are displayed) Labs Reviewed  BASIC METABOLIC PANEL - Abnormal; Notable for the following:       Result Value   BUN <5 (*)    Anion gap 4 (*)    All other components within normal limits  D-DIMER, QUANTITATIVE (NOT AT [facility]) - Abnormal; Notable for the following:    D-Dimer, Quant 0.72 (*)    All other components within normal limits  CBC  I-STAT TROPOININ, ED    EKG  EKG Interpretation  Date/Time:  Saturday December 06 2015 15:10:15 EDT Ventricular Rate:  77 PR Interval:  164 QRS Duration: 90 QT Interval:  362 QTC Calculation: 409 R Axis:   93 Text Interpretation:  Normal sinus rhythm Rightward axis Borderline ECG Nonspecific ST abnormality Confirmed by Wyvonnia Dusky  MD, STEPHEN [phone]) on 12/06/2015 4:05:26 PM       Radiology Dg Chest 2 View  Result Date: 12/06/2015 CLINICAL DATA:  LEFT side chest pain and  shortness of breath for 2 days, persistent cough, smoker, prior LEFT April VATS 2017 EXAM: CHEST  2 VIEW COMPARISON:  08/06/2015 FINDINGS: Normal heart size, mediastinal contours, and pulmonary vascularity. Lungs hyperinflated with minimal chronic interstitial prominence, stable. No definite infiltrate, pleural effusion or pneumothorax. Bones unremarkable. IMPRESSION: Hyperinflation and minimal chronic interstitial prominence. No acute abnormalities. Electronically Signed   By: Lavonia Dana M.D.   On: 12/06/2015 16:14   Ct Angio Chest Pe W/cm &/or Wo Cm  Result Date: 12/06/2015 CLINICAL DATA:  Nonradiating LEFT anterior chest pain beginning 2-3 days ago worse with deep breathing, coughing and movement, history of recurrent spontaneous pneumothorax EXAM: CT ANGIOGRAPHY CHEST WITH CONTRAST TECHNIQUE: Multidetector CT imaging of the chest was performed using the standard protocol during bolus administration of intravenous contrast. Multiplanar CT image reconstructions and MIPs were obtained to evaluate the vascular anatomy. CONTRAST:  100 cc Isovue 370 IV COMPARISON:  02/20/2014 FINDINGS: Cardiovascular: Aorta normal caliber. No pericardial effusion. Pulmonary arteries well opacified  and patent. No evidence of pulmonary embolism. Mediastinum/Nodes: Residual thymic tissue in anterior mediastinum. No thoracic adenopathy. Base of cervical region normal appearance. Esophagus unremarkable. Lungs/Pleura: Central peribronchial thickening. 6 mm RIGHT apex nodule image 22. LEFT apex stapling. Minimal RIGHT apical scarring. No infiltrate, pleural effusion, or pneumothorax. Upper Abdomen: Questionable tiny calculi versus excreted contrast at upper poles of both kidneys. Remaining visualized upper abdomen unremarkable. Musculoskeletal: Osseous structures unremarkable. Review of the MIP images confirms the above findings. IMPRESSION: No evidence of pulmonary embolism. Prior stapling at LEFT apex. Peribronchial thickening without  infiltrate. 6 mm RIGHT apex pulmonary nodule, recommendation below. Non-contrast chest CT at 6-12 months is recommended. If the nodule is stable at time of repeat CT, then future CT at 18-24 months (from today's scan) is considered optional for low-risk patients, but is recommended for high-risk patients. This recommendation follows the consensus statement: Guidelines for Management of Incidental Pulmonary Nodules Detected on CT Images:From the Fleischner Society 2017; published online before print (10.1148/radiol.IJ:2314499). Electronically Signed   By: Lavonia Dana M.D.   On: 12/06/2015 20:19    Procedures Procedures (including critical care time)  Medications Ordered in ED Medications  iopamidol (ISOVUE-370) 76 % injection (100 mLs  Contrast Given 12/06/15 1941)     Initial Impression / Assessment and Plan / ED Course  I have reviewed the triage vital signs and the nursing notes.  Pertinent labs & imaging results that were available during my care of the patient were reviewed by me and considered in my medical decision making (see chart for details).  Clinical Course   Patient with multiple recurrent spontaneous pneumothoraces status post VATS in April. A chest x-ray reassuring for no pneumothorax, however patient with typical pleuritic chest pain similar to previous episodes. Patient is in no acute distress and has no hypoxia. He has a history of tobacco use, but no other risk factors for PE. EKG is reassuring. No clinical signs of infection, laboratory workup was negative for leukocytosis. D-dimer is positive.  Plan to proceed with CT Angio for rule out of occult pneumo vs PE.  CT negative for pneumo and PE. Some nodularities noted recommended 59-month follow-up.   Final Clinical Impressions(s) / ED Diagnoses   Final diagnoses:  Pleurisy    New Prescriptions New Prescriptions   No medications on file     Holley Raring, MD 12/06/15 2032    Ezequiel Essex, MD 12/06/15  [id number]

## 2015-12-06 NOTE — Discharge Instructions (Signed)
No signs that your pain is related to a pulmonary embolism or pneumothorax. If symptoms persist, we recommend that you try to follow-up with your primary doctor.  It is recommended that you quit smoking in order to prevent any further damage to your lungs.  If you notice significantly increased worsening of pain, difficulty with breathing, or chest pain she return to emergency department.

## 2015-12-06 NOTE — ED Provider Notes (Signed)
I saw and evaluated the patient, reviewed the resident's note and I agree with the findings and plan. If applicable, I agree with the resident's interpretation of the EKG.  If applicable, I was present for critical portions of any procedures performed.    HPI Comments: Brendan Mathis is a 30 y.o. male who presents to the Emergency Department complaining of intermittent, non-radiating left sided anterior chest pain that began 2-3 days ago. He states the pain is worse with deep breathing, coughing, and movement. He reports feeling a popping sensation upon exhalation that only presents after a deep inspiration. He has a history of recurrent spontaneous pneumothorax and reports having a left video assisted thoracoscopy and stapling of blebs in April 2017. Patient states he has decreased sensation to the left chest since his last surgeries. He denies history of PE/DVT. He denies recent falls or injuries.   Physical Examination:  Heart: Regular rate and rhythm  Pulmonary: Equal breath sounds bilaterally  Chest: well healed surgical incisions   Chest x-ray shows no pneumothorax. No pneumonia. Patient does have pleuritic chest pain and d-dimer is positive. We'll obtain CT angio. Pain has been ongoing for 2-3 days. Doubt cardiac pain.  By signing my name below, I, Sonum Patel, attest that this documentation has been prepared under the direction and in the presence of Ezequiel Essex, MD. Electronically Signed: Sonum Patel, Education administrator. 12/06/15. 4:32 PM.    I personally performed the services described in this documentation, which was scribed in my presence. The recorded information has been reviewed and is accurate.    Ezequiel Essex, MD 12/06/15 2322

## 2015-12-06 NOTE — ED Notes (Signed)
Patient transported to X-ray 

## 2015-12-06 NOTE — ED Triage Notes (Signed)
Pt c/o 3 day history of L sided CP. Reports sob, cough. Pain increased with inspiration. Pain decreased with rest. He has hx pneumothorax and this feels the same. Pt is alert and breathing easily

## 2015-12-12 ENCOUNTER — Ambulatory Visit (INDEPENDENT_AMBULATORY_CARE_PROVIDER_SITE_OTHER): Payer: Medicaid Other | Admitting: Cardiothoracic Surgery

## 2015-12-12 ENCOUNTER — Encounter: Payer: Self-pay | Admitting: Cardiothoracic Surgery

## 2015-12-12 VITALS — BP 113/73 | HR 63 | Resp 16 | Ht 71.0 in | Wt 143.7 lb

## 2015-12-12 DIAGNOSIS — J939 Pneumothorax, unspecified: Secondary | ICD-10-CM | POA: Diagnosis not present

## 2015-12-12 DIAGNOSIS — Z09 Encounter for follow-up examination after completed treatment for conditions other than malignant neoplasm: Secondary | ICD-10-CM

## 2015-12-12 NOTE — Progress Notes (Signed)
PCP is Rochel Brome, MD Referring Provider is Lacretia Leigh, MD  Chief Complaint  Patient presents with  . Routine Post Op    concerned with recent left chest discomfort, went to ED....negative CTA CHEST     HPI: Patient is 30 years old actively smoking almost 6 months status post left VATS for stapling of blebs for spontaneous pneumothorax  The patient was evaluated in the ED over the past weekend for left chest discomfort and concern over recurrent pneumothorax. I personally reviewed the CT scan of the chest which showed no pneumothorax, well-healed surgical staple line from previous bleb resection and no evidence of PE  Patient does have some bleb formation in the right lung apex which is slightly more prominent than his CT scan previously. He also has a 5.5 cm nodule probable granuloma in the right upper lobe which will be followed with a future CT scan.  The patient's left chest wall pain has improved after his episode of heavy lifting. The surgical incisions are well-healed and there is no structural damage noted on CT scan to the chest wall  He can continue his current activities He is recommended to stop smoking He was given a prescription for Voltaren gel to apply to the areas that they become sore after lifting at his job  Past Medical History:  Diagnosis Date  . GERD (gastroesophageal reflux disease)    as a child  . Restless leg syndrome   . Spontaneous pneumothorax 02/17/2014   Left   . Tobacco abuse 02/17/2014    Past Surgical History:  Procedure Laterality Date  . Left chest tube placement     02/17/14  . STAPLING OF BLEBS Left 07/22/2015   Procedure: LEFT STAPLING OF BLEBS;  Surgeon: Ivin Poot, MD;  Location: [place];  Service: Thoracic;  Laterality: Left;  Marland Kitchen VIDEO ASSISTED THORACOSCOPY Left 07/22/2015   Procedure: LEFT VIDEO ASSISTED THORACOSCOPY;  Surgeon: Ivin Poot, MD;  Location: [facility] OR;  Service: Thoracic;  Laterality: Left;    Family History   Problem Relation Age of Onset  . Heart disease Father     Social History Social History  Substance Use Topics  . Smoking status: Current Every Day Smoker    Packs/day: 1.00    Types: Cigarettes  . Smokeless tobacco: Never Used  . Alcohol use Yes     Comment: occ    No current outpatient prescriptions on file.   No current facility-administered medications for this visit.     Allergies  Allergen Reactions  . Morphine And Related Nausea And Vomiting    Review of Systems  Smokes 1 pack per day No productive cough Weight stable No fever No shortness of breath No edema  BP 113/73   Pulse 63   Resp 16   Ht 5\' 11"  (1.803 m)   Wt 143 lb 11.8 oz (65.2 kg)   SpO2 98% Comment: ON RA  BMI 20.05 kg/m  Physical Exam      Exam    General- alert and comfortable   Lungs- clear without rales, wheezes   Cor- regular rate and rhythm, no murmur , gallop   Abdomen- soft, non-tender   Extremities - warm, non-tender, minimal edema   Neuro- oriented, appropriate, no focal weakness   Diagnostic Tests: CT scan personally reviewed and counseled patient showing normal postop changes in the left lung status post bleb resection.  Impression: Small 5.5 cm right upper lobe nodule in this 30 year old smoker This appears low risk for  malignancy but we'll get a follow-up surveillance scan in 1 year  Plan: CT scan of chest with contrast in 1 year   Len Childs, MD [facility] [phone]

## 2016-05-13 LAB — BASIC METABOLIC PANEL: Glucose: 90 mg/dL

## 2016-05-13 LAB — TSH: TSH: 0.49 u[IU]/mL (ref 0.41–5.90)

## 2016-06-29 ENCOUNTER — Ambulatory Visit (INDEPENDENT_AMBULATORY_CARE_PROVIDER_SITE_OTHER): Payer: Medicaid Other | Admitting: Endocrinology

## 2016-06-29 ENCOUNTER — Encounter: Payer: Self-pay | Admitting: Endocrinology

## 2016-06-29 VITALS — BP 100/58 | HR 55 | Ht 70.0 in | Wt 136.0 lb

## 2016-06-29 DIAGNOSIS — E049 Nontoxic goiter, unspecified: Secondary | ICD-10-CM | POA: Diagnosis not present

## 2016-06-29 NOTE — Progress Notes (Signed)
Patient ID: Brendan Mathis, male   DOB: [birth year], 31 y.o.   MRN: [id number]          Referring PCP name: Jeanie Sewer   Reason for Appointment: Goiter, new consultation    History of Present Illness:   The patient's thyroid enlargement was first discovered in 2/18 At that time he had presented to his primary care physician with some difficulty and discomfort with swallowing Also was being evaluated for general care  More recently he has had less difficulty with local discomfort and has no difficulty swallowing No choking sensation   Lab Results  Component Value Date   TSH 0.49 05/13/2016    He has had an ultrasound exam in 05/2016 which showed a normal size thyroid and a 4 mm incidental nodule    Allergies as of 06/29/2016      Reactions   Morphine And Related Nausea And Vomiting      Medication List       Accurate as of 06/29/16  8:37 AM. Always use your most recent med list.          clonazePAM 1 MG tablet Commonly known as:  KLONOPIN TK 1 T PO PRN   escitalopram 20 MG tablet Commonly known as:  LEXAPRO take 1 tablet daily       Allergies:  Allergies  Allergen Reactions  . Morphine And Related Nausea And Vomiting    Past Medical History:  Diagnosis Date  . GERD (gastroesophageal reflux disease)    as a child  . Restless leg syndrome   . Spontaneous pneumothorax 02/17/2014   Left   . Tobacco abuse 02/17/2014    Past Surgical History:  Procedure Laterality Date  . Left chest tube placement     02/17/14  . STAPLING OF BLEBS Left 07/22/2015   Procedure: LEFT STAPLING OF BLEBS;  Surgeon: Ivin Poot, MD;  Location: [place];  Service: Thoracic;  Laterality: Left;  Marland Kitchen VIDEO ASSISTED THORACOSCOPY Left 07/22/2015   Procedure: LEFT VIDEO ASSISTED THORACOSCOPY;  Surgeon: Ivin Poot, MD;  Location: [facility] OR;  Service: Thoracic;  Laterality: Left;    Family History  Problem Relation Age of Onset  . Heart disease Father     Social History:   reports that he has been smoking Cigarettes.  He has been smoking about 1.00 pack per day. He has never used smokeless tobacco. He reports that he drinks alcohol. He reports that he uses drugs, including Marijuana.    Review of Systems  Constitutional: Positive for malaise. Negative for weight loss.  HENT: Negative for hoarseness.   Cardiovascular: Negative for leg swelling.  Gastrointestinal: Negative for constipation.  Endocrine: Negative for cold intolerance.  Musculoskeletal: Negative for joint pain.  Psychiatric/Behavioral: Positive for nervousness.       His moods are improved with starting Lexapro from PCP      Examination:   BP (!) 100/58   Pulse (!) 55   Ht 5\' 10"  (1.778 m)   Wt 136 lb (61.7 kg)   SpO2 97%   BMI 19.51 kg/m    General Appearance: pleasant, averagely built and nourished         Eyes: No  prominence or eyelid swelling.          Neck: The thyroid is mildly enlarged and mostly on the right side, slightly firm and about 1-1/2 times normal without a distinct nodule.  Left side not clearly palpable No tenderness locally  There is no lymphadenopathy.  Cardiovascular: Normal  heart sounds, no murmur Respiratory:  Lungs clear Neurological: REFLEXES: at biceps are normal.  Skin: no rash        Assessment/Plan:  Small goiter, mostly right sided enlargement Although he initially had some local discomfort and difficulty swallowing symptoms are resolving Not clear if he may have Hashimoto thyroiditis causing the mild increase in thyroid size and local discomfort He is not having any significant symptoms at this time Thyroid functions are quite normal  Discussed that if he has Hashimoto thyroiditis this may potentially cause hypothyroidism in the long run Currently no further evaluation or treatment is needed  He can have thyroid functions done annually with his PCP, to be seen here as needed  Patient was explained the nature of his condition and FRIENDS  were answered  [facility] 06/29/2016  Consultation note sent to PCP

## 2016-11-26 ENCOUNTER — Other Ambulatory Visit: Payer: Self-pay | Admitting: Cardiothoracic Surgery

## 2016-11-26 DIAGNOSIS — R911 Solitary pulmonary nodule: Secondary | ICD-10-CM

## 2016-12-20 ENCOUNTER — Other Ambulatory Visit: Payer: Self-pay | Admitting: Cardiothoracic Surgery

## 2016-12-29 ENCOUNTER — Encounter: Payer: Self-pay | Admitting: Cardiothoracic Surgery

## 2016-12-29 ENCOUNTER — Ambulatory Visit
Admission: RE | Admit: 2016-12-29 | Discharge: 2016-12-29 | Disposition: A | Discharge status: Home / Self Care | Payer: Medicaid Other | Source: Ambulatory Visit | Attending: Cardiothoracic Surgery | Admitting: Cardiothoracic Surgery

## 2016-12-29 ENCOUNTER — Ambulatory Visit (INDEPENDENT_AMBULATORY_CARE_PROVIDER_SITE_OTHER): Payer: Medicaid Other | Admitting: Cardiothoracic Surgery

## 2016-12-29 VITALS — BP 130/90 | HR 64 | Resp 16 | Ht 70.0 in | Wt 150.0 lb

## 2016-12-29 DIAGNOSIS — R911 Solitary pulmonary nodule: Secondary | ICD-10-CM

## 2016-12-29 DIAGNOSIS — J439 Emphysema, unspecified: Secondary | ICD-10-CM

## 2016-12-29 DIAGNOSIS — J939 Pneumothorax, unspecified: Secondary | ICD-10-CM | POA: Diagnosis not present

## 2016-12-29 DIAGNOSIS — D381 Neoplasm of uncertain behavior of trachea, bronchus and lung: Secondary | ICD-10-CM

## 2016-12-29 DIAGNOSIS — Z09 Encounter for follow-up examination after completed treatment for conditions other than malignant neoplasm: Secondary | ICD-10-CM | POA: Diagnosis not present

## 2016-12-29 MED ORDER — IOPAMIDOL (ISOVUE-300) INJECTION 61%
75.0000 mL | Freq: Once | INTRAVENOUS | Status: AC | PRN
  Administered 2016-12-29: 75 mL via INTRAVENOUS

## 2016-12-29 NOTE — Progress Notes (Signed)
PCP is Cox, Elnita Maxwell, MD Referring Provider is Lacretia Leigh, MD  Chief Complaint  Patient presents with  . Routine Post Op    1 yr f/u with CT CHEST to reassess RULobe nodule and right lung bleb    HPI: The patient is a 31 year old smoker who returns for one year follow-up with CT scan of chest. A year ago he underwent left VATS for resection of a large bleb and spontaneous pneumothorax. At that time CT scan showed a 5-6 mm right apical nodule and follow-up CT scan was recommended. The patient continues to smoke one half pack per day. He denies any pulmonary symptoms. He has minimal postthoracotomy type pain on his left side from previous surgery.  CT scan performed today shows no change in the small 5 mm smooth nodular density in the apex of the right lung probable subpleural lymph node. This is benign and no further x-ray imaging follow-up is needed.  Past Medical History:  Diagnosis Date  . GERD (gastroesophageal reflux disease)    as a child  . Restless leg syndrome   . Spontaneous pneumothorax 02/17/2014   Left   . Tobacco abuse 02/17/2014    Past Surgical History:  Procedure Laterality Date  . Left chest tube placement     02/17/14  . STAPLING OF BLEBS Left 07/22/2015   Procedure: LEFT STAPLING OF BLEBS;  Surgeon: Ivin Poot, MD;  Location: [place];  Service: Thoracic;  Laterality: Left;  Marland Kitchen VIDEO ASSISTED THORACOSCOPY Left 07/22/2015   Procedure: LEFT VIDEO ASSISTED THORACOSCOPY;  Surgeon: Ivin Poot, MD;  Location: [facility] OR;  Service: Thoracic;  Laterality: Left;    Family History  Problem Relation Age of Onset  . Heart disease Father     Social History Social History  Substance Use Topics  . Smoking status: Current Every Day Smoker    Packs/day: 1.00    Types: Cigarettes  . Smokeless tobacco: Never Used  . Alcohol use Yes     Comment: occ    No current outpatient prescriptions on file.   No current facility-administered medications for this visit.      Allergies  Allergen Reactions  . Morphine And Related Nausea And Vomiting    Review of Systems  No weight loss or fever No chest pain or shortness of breath No abdominal pain No headache No edema  BP 130/90 (BP Location: Left Arm, Patient Position: Sitting, Cuff Size: Large)   Pulse 64   Resp 16   Ht 5\' 10"  (1.778 m)   Wt 150 lb (68 kg)   SpO2 99% Comment: ON RA  BMI 21.52 kg/m  Physical Exam      Exam    General- alert and comfortable   Lungs- clear without rales, wheezes   Cor- regular rate and rhythm, no murmur , gallop   Abdomen- soft, non-tender   Extremities - warm, non-tender, minimal edema   Neuro- oriented, appropriate, no focal weakness     Diagnostic Tests: CT scan images personally reviewed and counseled with patient showing benign stable 5 mm right apical nodule consistent with subpleural lymph node  Impression: Benign right upper lobe nodule  Plan:no further x-ray imaging needed. Patient again recommended to follow complete smoking cessation.   Len Childs, MD [facility] [phone]

## 2017-06-24 DIAGNOSIS — J209 Acute bronchitis, unspecified: Secondary | ICD-10-CM

## 2017-06-24 DIAGNOSIS — D696 Thrombocytopenia, unspecified: Secondary | ICD-10-CM | POA: Diagnosis not present

## 2017-06-24 DIAGNOSIS — J189 Pneumonia, unspecified organism: Secondary | ICD-10-CM

## 2017-06-24 DIAGNOSIS — J9601 Acute respiratory failure with hypoxia: Secondary | ICD-10-CM | POA: Diagnosis not present

## 2017-06-24 DIAGNOSIS — F17210 Nicotine dependence, cigarettes, uncomplicated: Secondary | ICD-10-CM

## 2017-06-25 DIAGNOSIS — J189 Pneumonia, unspecified organism: Secondary | ICD-10-CM | POA: Diagnosis not present

## 2017-06-25 DIAGNOSIS — D696 Thrombocytopenia, unspecified: Secondary | ICD-10-CM | POA: Diagnosis not present

## 2017-06-25 DIAGNOSIS — J9601 Acute respiratory failure with hypoxia: Secondary | ICD-10-CM | POA: Diagnosis not present

## 2017-06-25 DIAGNOSIS — J209 Acute bronchitis, unspecified: Secondary | ICD-10-CM | POA: Diagnosis not present

## 2017-06-26 DIAGNOSIS — J209 Acute bronchitis, unspecified: Secondary | ICD-10-CM | POA: Diagnosis not present

## 2017-06-26 DIAGNOSIS — D696 Thrombocytopenia, unspecified: Secondary | ICD-10-CM | POA: Diagnosis not present

## 2017-06-26 DIAGNOSIS — J9601 Acute respiratory failure with hypoxia: Secondary | ICD-10-CM | POA: Diagnosis not present

## 2017-06-26 DIAGNOSIS — J189 Pneumonia, unspecified organism: Secondary | ICD-10-CM | POA: Diagnosis not present

## 2018-02-05 IMAGING — CR DG CHEST 2V
2 series · 2 of 2 positions shown · non-contrast
Comparison: Portable chest x-ray dated July 13, 2015.

CLINICAL DATA: History of left pneumothorax, some persistent pain,
current smoker.

EXAM:
CHEST  2 VIEW

[w chest pa]
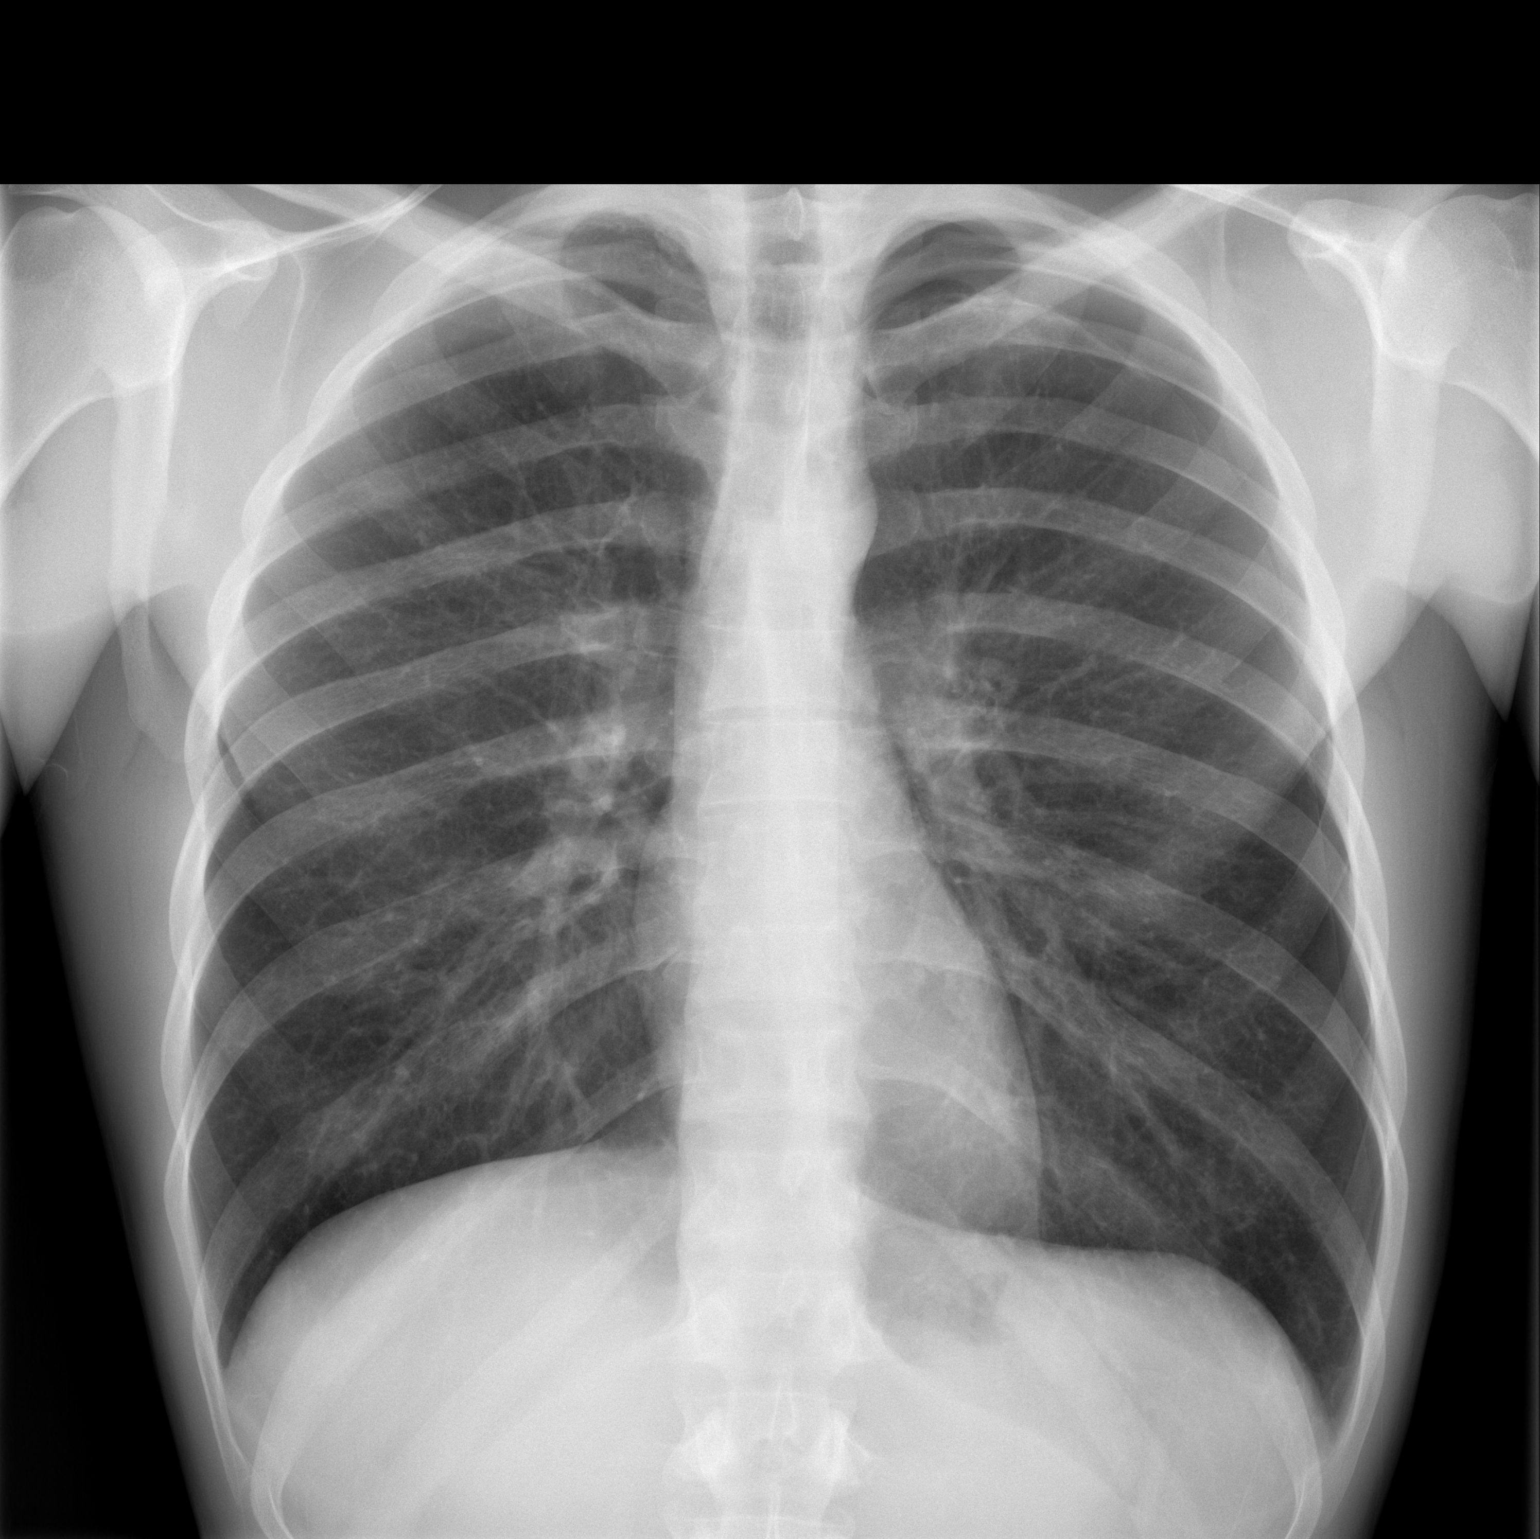

[w chest lat]
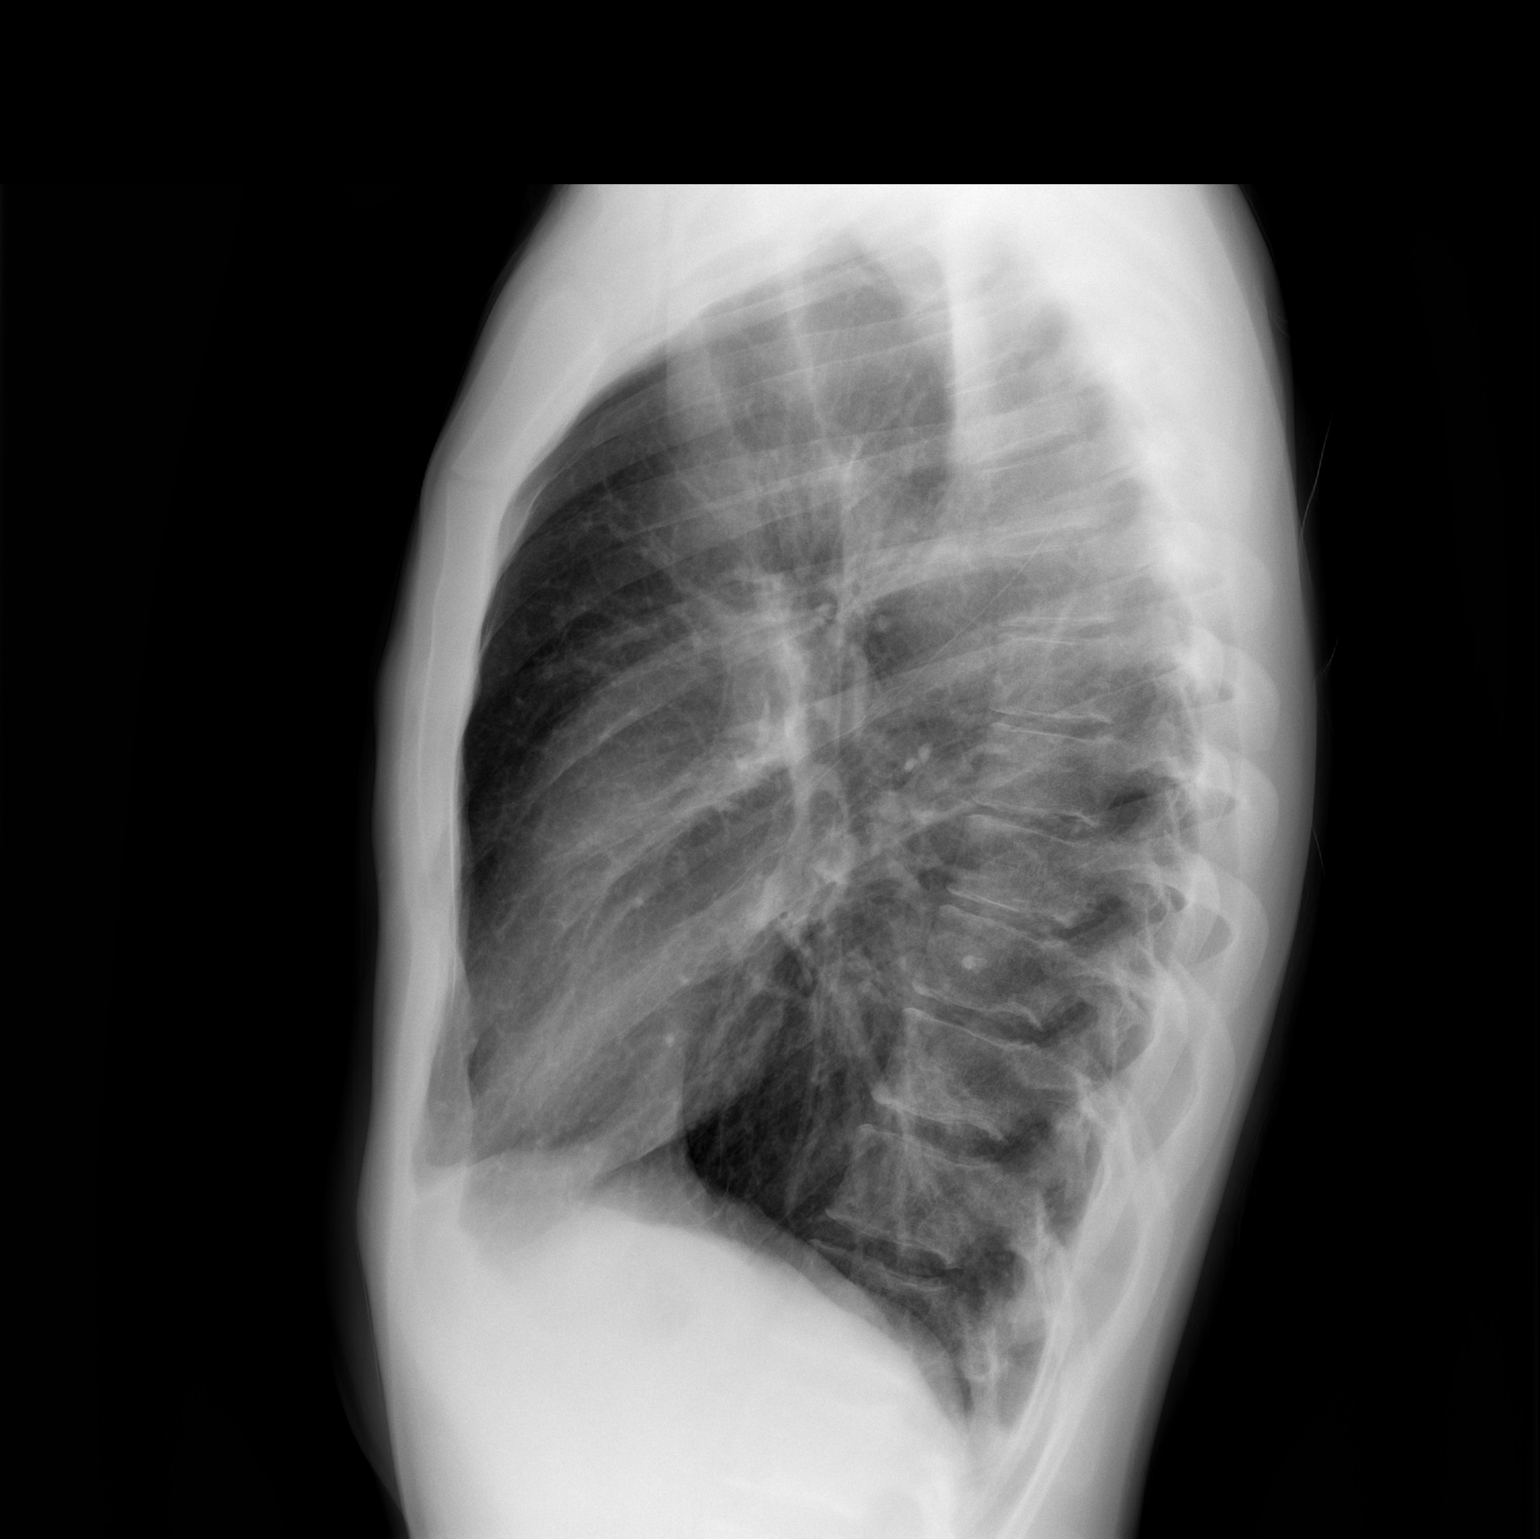

[2 of 2 positions shown; findings below may reference images not displayed]

FINDINGS: The left-sided pneumothorax has increased in size and now amounts to
10-15% of the lung volume. There is no pleural effusion. The right
lung is well-expanded and clear. The heart and pulmonary vascularity
are normal. The bony thorax exhibits no acute abnormality.
IMPRESSION: Slight interval increase in size of the left-sided pneumothorax such
that it now amounts to 10% to 15% of the lung volume. Critical
Value/emergent results were called by telephone at the time of
interpretation on 07/16/2015 at [DATE] to Jonitaa Tulon, RN,, who
verbally acknowledged these results.

## 2018-02-13 IMAGING — CR DG CHEST 1V PORT
1 series · 1 of 1 positions shown · non-contrast
Comparison: Yesterday

CLINICAL DATA: Pneumothorax

EXAM:
PORTABLE CHEST 1 VIEW

[AP]
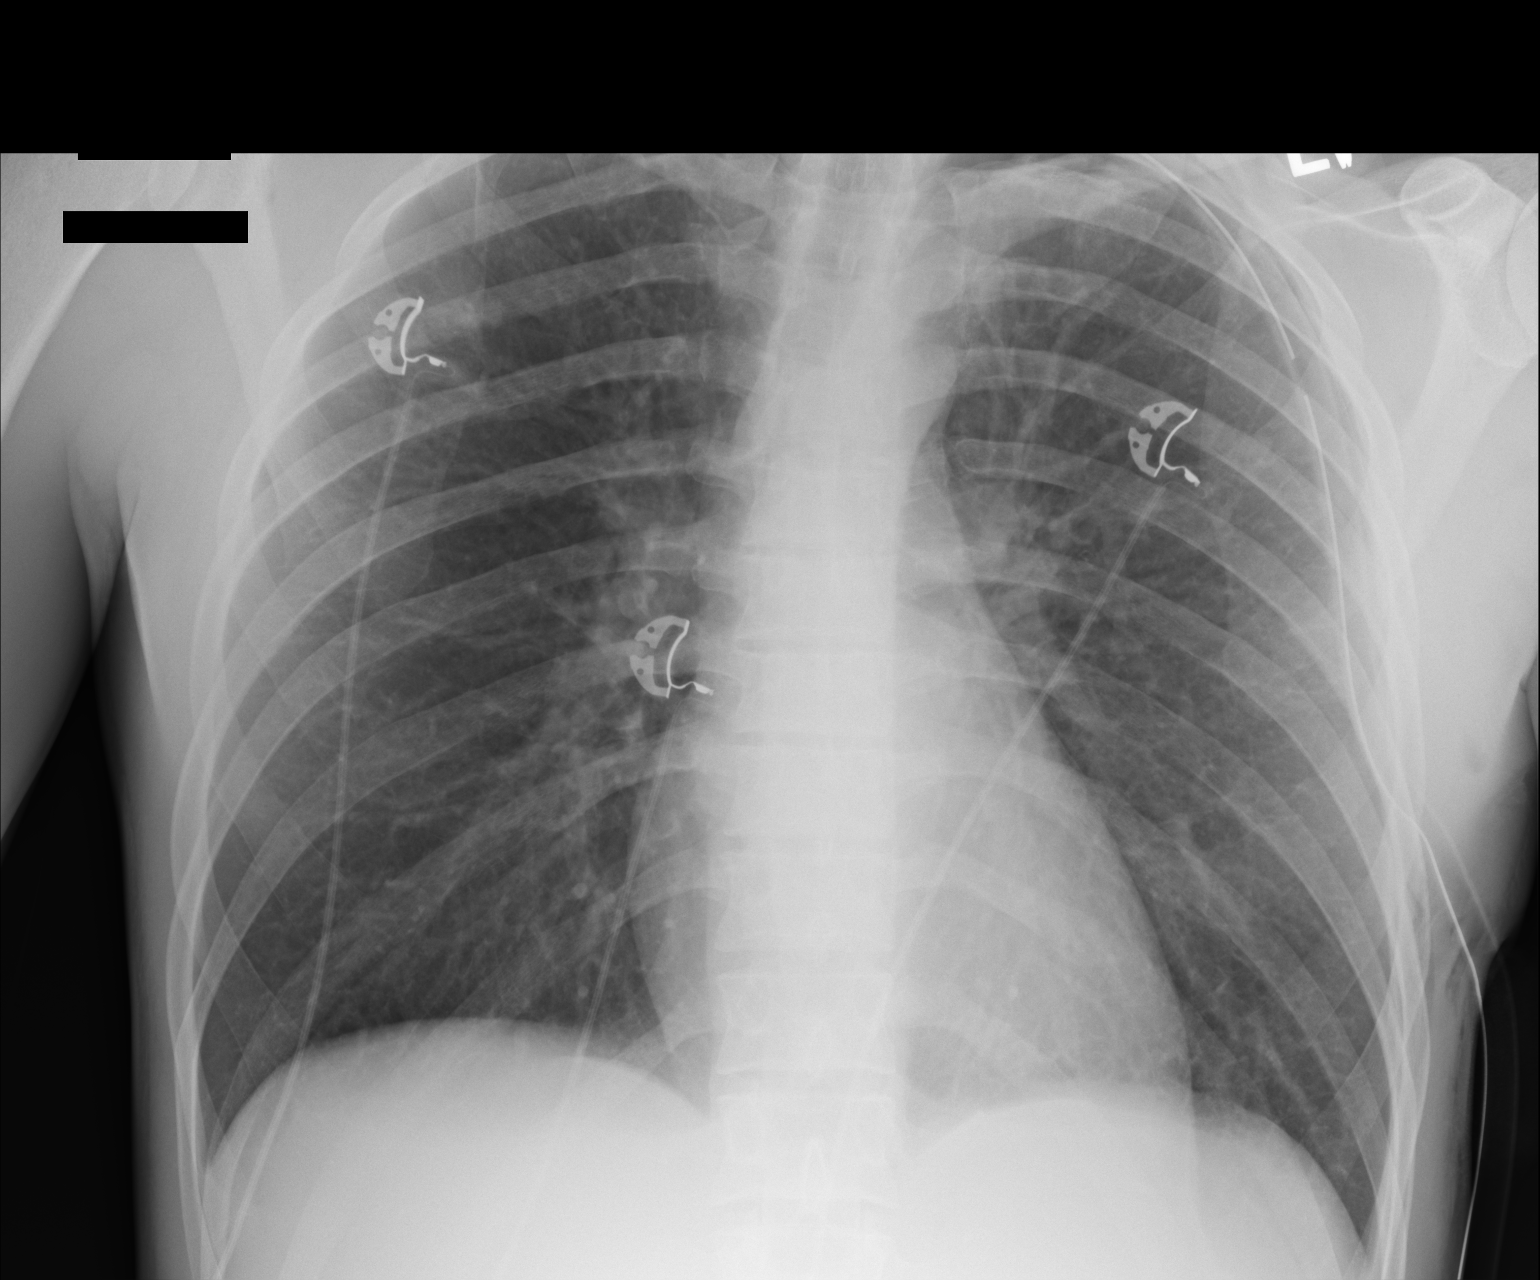

[1 of 1 positions shown; findings below may reference images not displayed]

FINDINGS: Left chest tube is stable. 5% left apical pneumothorax is stable.
Lungs remain hyperaerated and clear. Normal heart size.
IMPRESSION: Stable left chest tube and 5% left apical pneumothorax.

## 2018-12-30 ENCOUNTER — Emergency Department (HOSPITAL_COMMUNITY)
Admission: EM | Admit: 2018-12-30 | Discharge: 2018-12-30 | Disposition: A | Discharge status: Home / Self Care | Payer: Self-pay | Attending: Emergency Medicine | Admitting: Emergency Medicine

## 2018-12-30 ENCOUNTER — Other Ambulatory Visit: Payer: Self-pay

## 2018-12-30 ENCOUNTER — Encounter (HOSPITAL_COMMUNITY): Payer: Self-pay

## 2018-12-30 ENCOUNTER — Emergency Department (HOSPITAL_COMMUNITY): Payer: Self-pay

## 2018-12-30 DIAGNOSIS — R05 Cough: Secondary | ICD-10-CM | POA: Insufficient documentation

## 2018-12-30 DIAGNOSIS — M546 Pain in thoracic spine: Secondary | ICD-10-CM | POA: Insufficient documentation

## 2018-12-30 DIAGNOSIS — F17210 Nicotine dependence, cigarettes, uncomplicated: Secondary | ICD-10-CM | POA: Insufficient documentation

## 2018-12-30 DIAGNOSIS — R059 Cough, unspecified: Secondary | ICD-10-CM

## 2018-12-30 MED ORDER — KETOROLAC TROMETHAMINE 15 MG/ML IJ SOLN
15.0000 mg | Freq: Once | INTRAMUSCULAR | Status: DC
  Filled 2018-12-30: qty 1

## 2018-12-30 MED ORDER — BENZONATATE 100 MG PO CAPS: 100.0000 mg | ORAL_CAPSULE | Freq: Three times a day (TID) | ORAL | 0 refills | Status: AC

## 2018-12-30 NOTE — ED Notes (Signed)
Pt left prior to medication and DC instructions review.

## 2018-12-30 NOTE — ED Triage Notes (Signed)
Patient complains of cough with headache and back pain x 2 days. States that he has pneumonia in past. Patient smoker, no distress

## 2018-12-30 NOTE — ED Provider Notes (Signed)
[place] EMERGENCY DEPARTMENT Provider Note   CSN: [id number] Arrival date & time: 12/30/18  1419     History   Chief Complaint Chief Complaint  Patient presents with  . back pain/ cough    HPI Brendan Mathis is a 33 y.o. male.     Patient is a 33 year old gentleman past medical history of GERD, spontaneous pneumothorax presenting to the emergency department for cough and back pain.  Patient reports that this feels similar to in the past when he had pneumonia.  Denies any fevers, chills, nausea, vomiting, chest pain, shortness of breath.  He has not tried anything for relief.  Denies any known exposure to COVID-19.     Past Medical History:  Diagnosis Date  . GERD (gastroesophageal reflux disease)    as a child  . Restless leg syndrome   . Spontaneous pneumothorax 02/17/2014   Left   . Tobacco abuse 02/17/2014    Patient Active Problem List   Diagnosis Date Noted  . 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 Spontaneous pneumothorax 02/17/2014  . Tobacco abuse 02/17/2014    Past Surgical History:  Procedure Laterality Date  . Left chest tube placement     02/17/14  . STAPLING OF BLEBS Left 07/22/2015   Procedure: LEFT STAPLING OF BLEBS;  Surgeon: Ivin Poot, MD;  Location: [place];  Service: Thoracic;  Laterality: Left;  Marland Kitchen VIDEO ASSISTED THORACOSCOPY Left 07/22/2015   Procedure: LEFT VIDEO ASSISTED THORACOSCOPY;  Surgeon: Ivin Poot, MD;  Location: [place];  Service: Thoracic;  Laterality: Left;        Home Medications    Prior to Admission medications   Medication Sig Start Date End Date Taking? Authorizing Provider  benzonatate (TESSALON) 100 MG capsule Take 1 capsule (100 mg total) by mouth every 8 (eight) hours. 12/30/18   Alveria Apley, PA-C    Family History Family History  Problem Relation Age of Onset  . Heart disease Father     Social History Social History   Tobacco Use  . Smoking status: Current Every Day  Smoker    Packs/day: 1.00    Types: Cigarettes  . Smokeless tobacco: Never Used  Substance Use Topics  . Alcohol use: Yes    Comment: occ  . Drug use: Yes    Types: Marijuana    Comment: occasional use - (last use April 2016)     Allergies   Morphine and related   Review of Systems Review of Systems  Constitutional: Negative for activity change, appetite change, chills, fatigue and fever.  HENT: Negative for congestion, ear pain, rhinorrhea, sinus pain and sore throat.   Respiratory: Positive for cough. Negative for shortness of breath.   Cardiovascular: Negative for chest pain.  Gastrointestinal: Negative for abdominal pain, nausea and vomiting.  Genitourinary: Negative for dysuria.  Musculoskeletal: Negative for back pain and myalgias.  Skin: Negative for rash.  Allergic/Immunologic: Negative for immunocompromised state.  Neurological: Negative for dizziness and headaches.  Hematological: Does not bruise/bleed easily.     Physical Exam Updated Vital Signs BP (!) 133/96 (BP Location: Left Arm)   Pulse 91   Temp 98.2 F (36.8 C) (Oral)   Resp 16   SpO2 98%   Physical Exam Vitals signs and nursing note reviewed.  Constitutional:      Appearance: Normal appearance.  HENT:     Head: Normocephalic.     Nose: Nose normal.     Mouth/Throat:     Mouth: Mucous  membranes are moist.  Eyes:     Conjunctiva/sclera: Conjunctivae normal.  Cardiovascular:     Rate and Rhythm: Normal rate and regular rhythm.  Pulmonary:     Effort: Pulmonary effort is normal. No respiratory distress.     Breath sounds: Normal breath sounds. No stridor. No wheezing or rhonchi.  Chest:     Chest wall: No tenderness.  Skin:    General: Skin is dry.     Capillary Refill: Capillary refill takes less than 2 seconds.  Neurological:     Mental Status: He is alert.  Psychiatric:        Mood and Affect: Mood normal.      ED Treatments / Results  Labs (all labs ordered are listed, but  only abnormal results are displayed) Labs Reviewed - No data to display  EKG None  Radiology Dg Chest 2 View  Result Date: 12/30/2018 CLINICAL DATA:  Cough EXAM: CHEST - 2 VIEW COMPARISON:  06/24/2017, CT 12/29/2016 FINDINGS: Hyperinflation. Mild a pickle scarring. Normal heart size. No focal airspace disease, pleural effusion, or pneumothorax. IMPRESSION: No active cardiopulmonary disease.  Hyperinflation. Electronically Signed   By: Donavan Foil M.D.   On: 12/30/2018 15:44    Procedures Procedures (including critical care time)  Medications Ordered in ED Medications  ketorolac (TORADOL) 15 MG/ML injection 15 mg (has no administration in time range)     Initial Impression / Assessment and Plan / ED Course  I have reviewed the triage vital signs and the nursing notes.  Pertinent labs & imaging results that were available during my care of the patient were reviewed by me and considered in my medical decision making (see chart for details).  Clinical Course as of Dec 29 1549  Sat Dec 30, 2018  1549 Patient chest x-ray clear and symptoms improved with Toradol.  I will send Ladona Ridgel to the pharmacy.  Low suspicion for COVID-19.  Patient advised on return precautions.   [KM]    Clinical Course User Index [KM] Alveria Apley, PA-C       Based on review of vitals, medical screening exam, lab work and/or imaging, there does not appear to be an acute, emergent etiology for the patient's symptoms. Counseled pt on good return precautions and encouraged both PCP and ED follow-up as needed.  Prior to discharge, I also discussed incidental imaging findings with patient in detail and advised appropriate, recommended follow-up in detail.  Clinical Impression: 1. Cough     Disposition: Discharge  Prior to providing a prescription for a controlled substance, I independently reviewed the patient's recent prescription history on the [place]. The patient had no recent or regular prescriptions and was deemed appropriate for a brief, less than 3 day prescription of narcotic for acute analgesia.  This note was prepared with assistance of Systems analyst. Occasional wrong-word or sound-a-like substitutions may have occurred due to the inherent limitations of voice recognition software.   Final Clinical Impressions(s) / ED Diagnoses   Final diagnoses:  Cough    ED Discharge Orders         Ordered    benzonatate (TESSALON) 100 MG capsule  Every 8 hours     12/30/18 1551           Kristine Royal 12/30/18 1551    Charlesetta Shanks, MD 12/31/18 [phone]

## 2021-01-04 ENCOUNTER — Encounter (HOSPITAL_COMMUNITY): Payer: Self-pay | Admitting: Emergency Medicine

## 2021-01-04 ENCOUNTER — Emergency Department (HOSPITAL_COMMUNITY)
Admission: EM | Admit: 2021-01-04 | Discharge: 2021-01-04 | Disposition: A | Discharge status: Home / Self Care | Payer: Medicaid Other | Attending: Emergency Medicine | Admitting: Emergency Medicine

## 2021-01-04 ENCOUNTER — Other Ambulatory Visit: Payer: Self-pay

## 2021-01-04 ENCOUNTER — Emergency Department (HOSPITAL_COMMUNITY): Payer: Medicaid Other

## 2021-01-04 DIAGNOSIS — Z5321 Procedure and treatment not carried out due to patient leaving prior to being seen by health care provider: Secondary | ICD-10-CM | POA: Insufficient documentation

## 2021-01-04 DIAGNOSIS — R079 Chest pain, unspecified: Secondary | ICD-10-CM | POA: Diagnosis present

## 2021-01-04 DIAGNOSIS — R0602 Shortness of breath: Secondary | ICD-10-CM | POA: Diagnosis not present

## 2021-01-04 HISTORY — DX: Pneumonia, unspecified organism: J18.9

## 2021-01-04 HISTORY — DX: Anxiety disorder, unspecified: F41.9

## 2021-01-04 HISTORY — DX: Depression, unspecified: F32.A

## 2021-01-04 LAB — BASIC METABOLIC PANEL
Anion gap: 10 (ref 5–15)
BUN: 6 mg/dL (ref 6–20)
CO2: 24 mmol/L (ref 22–32)
Calcium: 9.1 mg/dL (ref 8.9–10.3)
Chloride: 101 mmol/L (ref 98–111)
Creatinine, Ser: 0.87 mg/dL (ref 0.61–1.24)
GFR, Estimated: >60 mL/min (ref >60)
Glucose, Bld: 132 mg/dL — ABNORMAL HIGH (ref 70–99)
Potassium: 3.9 mmol/L (ref 3.5–5.1)
Sodium: 135 mmol/L (ref 135–145)

## 2021-01-04 LAB — CBC
HCT: 44.8 % (ref 39.0–52.0)
Hemoglobin: 15.4 g/dL (ref 13.0–17.0)
MCH: 29.3 pg (ref 26.0–34.0)
MCHC: 34.4 g/dL (ref 30.0–36.0)
MCV: 85.2 fL (ref 80.0–100.0)
Platelets: 273 10*3/uL (ref 150–400)
RBC: 5.26 MIL/uL (ref 4.22–5.81)
RDW: 12.9 % (ref 11.5–15.5)
WBC: 15.3 10*3/uL — ABNORMAL HIGH (ref 4.0–10.5)
nRBC: 0 % (ref 0.0–0.2)

## 2021-01-04 LAB — TROPONIN I (HIGH SENSITIVITY): Troponin I (High Sensitivity): 4 ng/L (ref <18)

## 2021-01-04 NOTE — ED Triage Notes (Signed)
Pt reports chest pain that radiates to back and SOB x 10 days.  States it feels like previous pneumonia.

## 2023-07-28 IMAGING — DX DG CHEST 2V
2 series · 2 of 2 positions shown · non-contrast
Comparison: Chest x-ray 12/30/2018.

CLINICAL DATA: Chest pain.

EXAM:
CHEST - 2 VIEW

[chest pa]
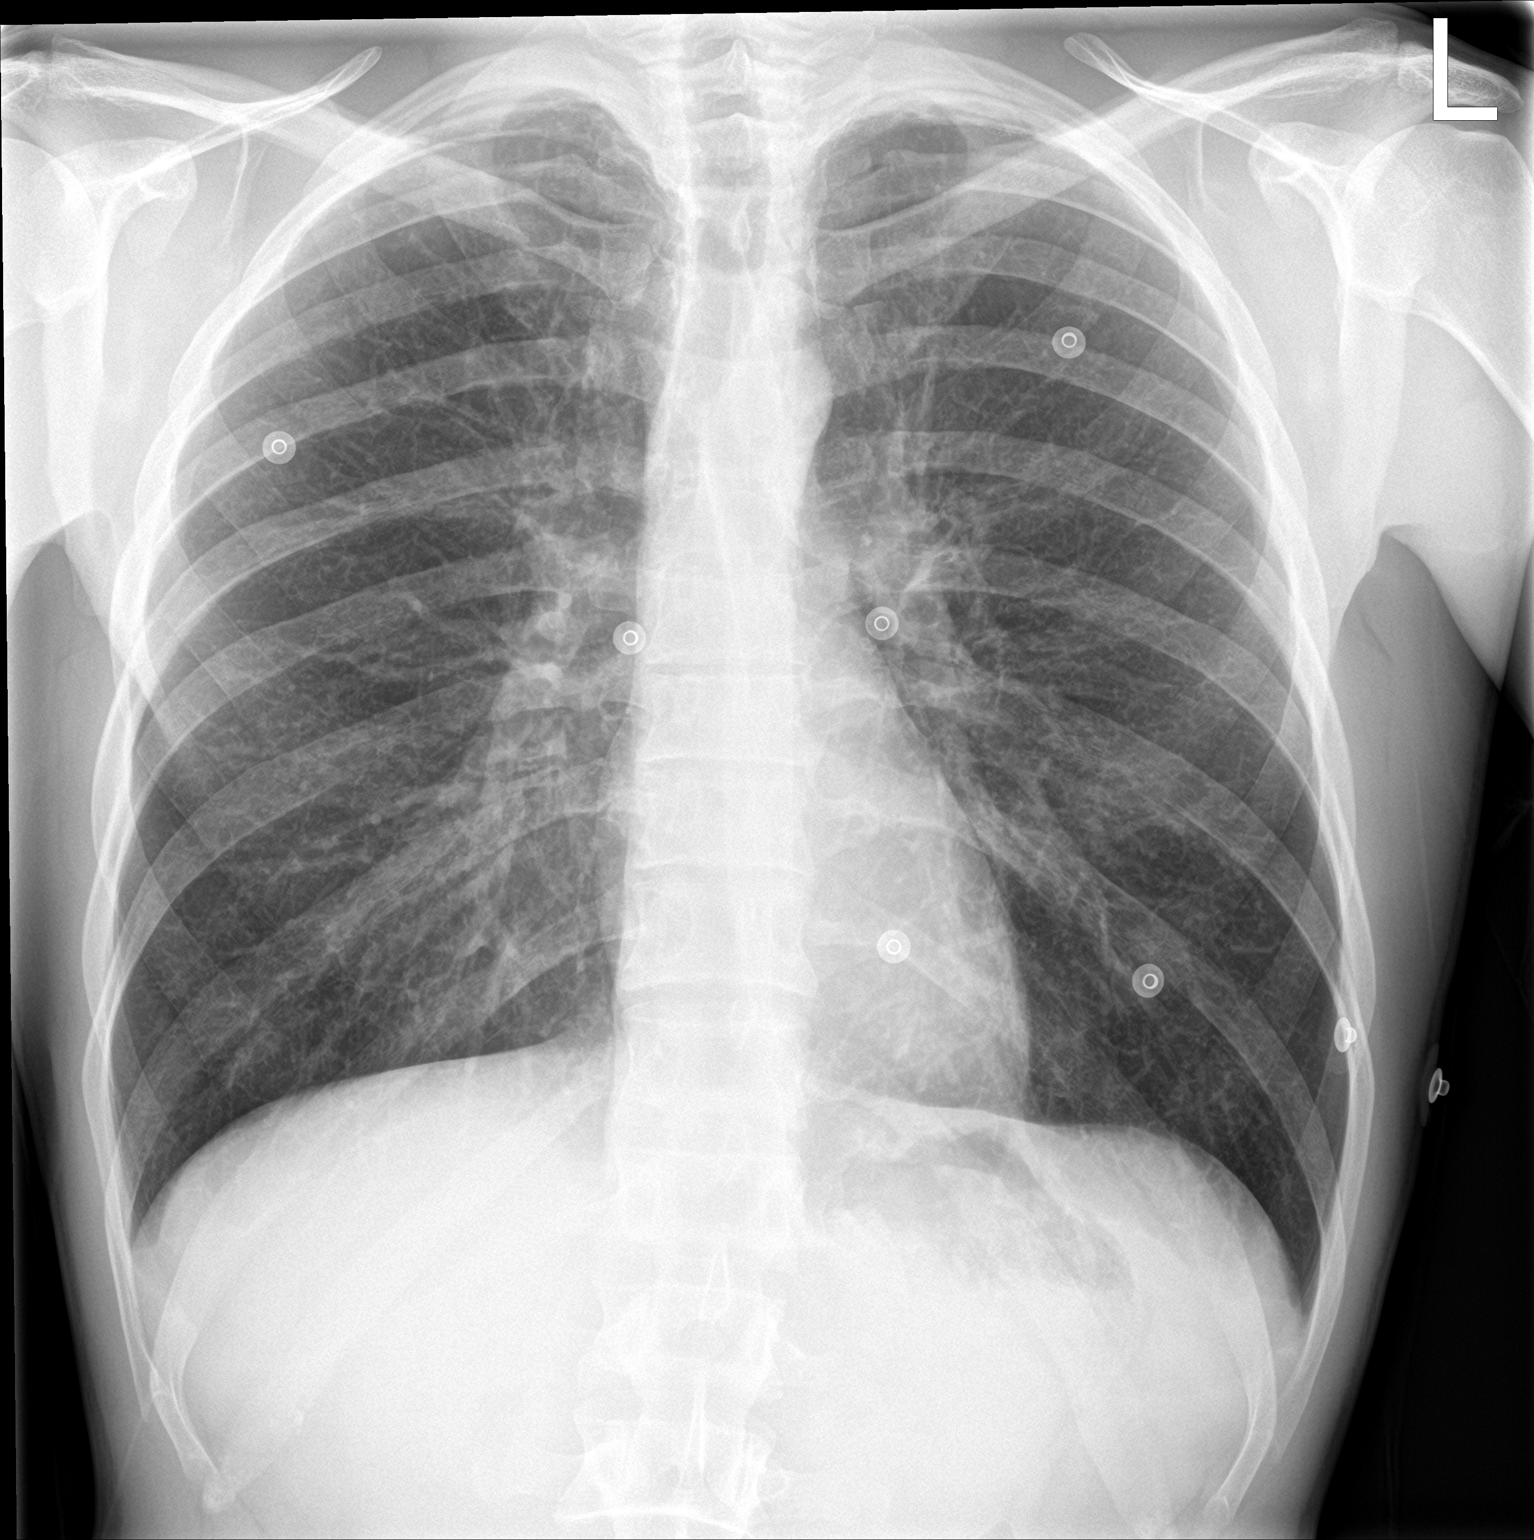

[chest lat]
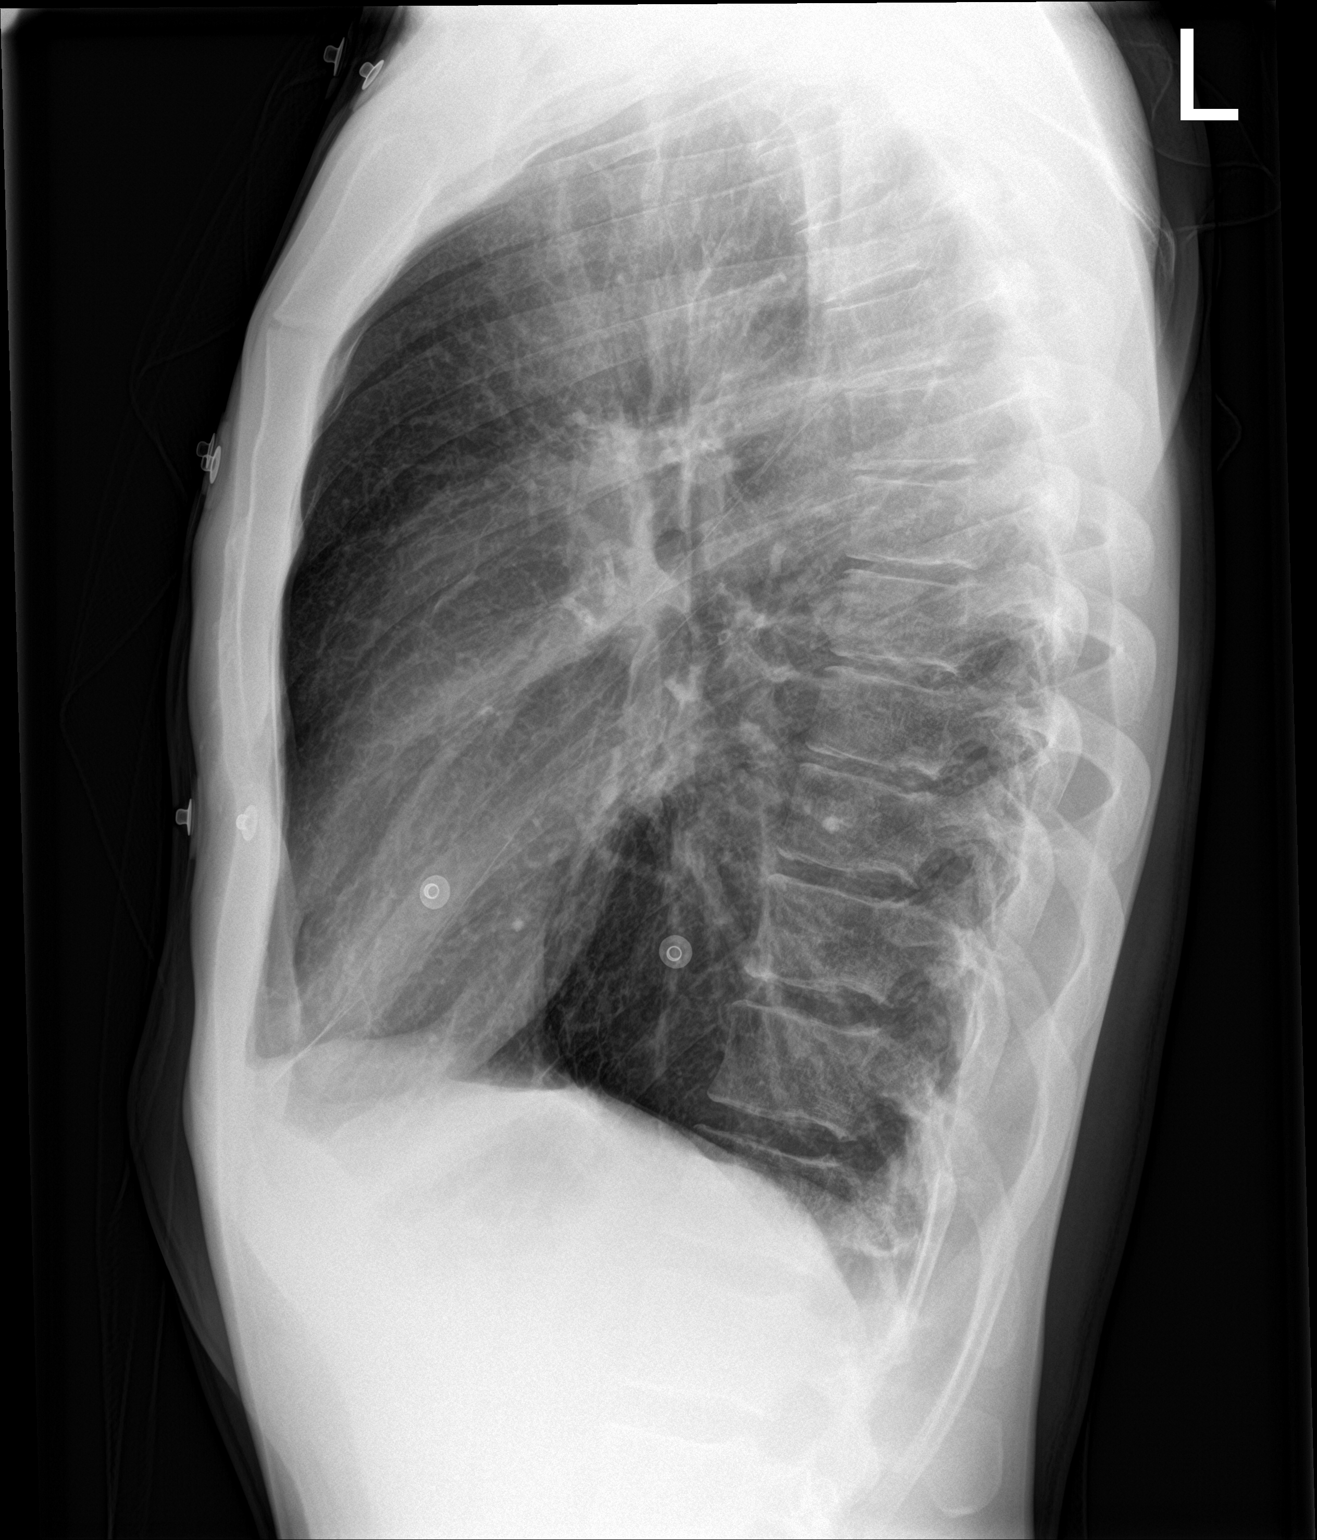

[2 of 2 positions shown; findings below may reference images not displayed]

FINDINGS: The heart size and mediastinal contours are within normal limits.
Both lungs are clear. The visualized skeletal structures are
unremarkable.
IMPRESSION: No active cardiopulmonary disease.
